# Patient Record
Sex: Female | Born: 1995 | Race: White | Hispanic: No | Marital: Married | Smoking: Never smoker
Health system: Southern US, Community
[De-identification: ages and names within clinical notes are randomized; demographics above are authoritative.]

## PROBLEM LIST (undated history)

## (undated) ENCOUNTER — Inpatient Hospital Stay (HOSPITAL_COMMUNITY): Payer: Self-pay

## (undated) DIAGNOSIS — N200 Calculus of kidney: Secondary | ICD-10-CM

## (undated) DIAGNOSIS — E079 Disorder of thyroid, unspecified: Secondary | ICD-10-CM

## (undated) DIAGNOSIS — E039 Hypothyroidism, unspecified: Secondary | ICD-10-CM

## (undated) DIAGNOSIS — Z87442 Personal history of urinary calculi: Secondary | ICD-10-CM

## (undated) HISTORY — PX: WISDOM TOOTH EXTRACTION: SHX21

## (undated) HISTORY — PX: CYSTOSCOPY W/ URETERAL STENT REMOVAL: SHX1430

---
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
## 2018-02-01 LAB — OB RESULTS CONSOLE GC/CHLAMYDIA
Chlamydia: NEGATIVE
Gonorrhea: NEGATIVE

## 2018-03-13 LAB — OB RESULTS CONSOLE RUBELLA ANTIBODY, IGM: Rubella: IMMUNE

## 2018-03-13 LAB — OB RESULTS CONSOLE ABO/RH: RH Type: POSITIVE

## 2018-03-13 LAB — OB RESULTS CONSOLE HIV ANTIBODY (ROUTINE TESTING): HIV: NONREACTIVE

## 2018-03-13 LAB — OB RESULTS CONSOLE HEPATITIS B SURFACE ANTIGEN: Hepatitis B Surface Ag: NEGATIVE

## 2018-03-13 LAB — OB RESULTS CONSOLE ANTIBODY SCREEN: ANTIBODY SCREEN: NEGATIVE

## 2018-03-13 LAB — OB RESULTS CONSOLE RPR: RPR: NONREACTIVE

## 2018-06-03 ENCOUNTER — Other Ambulatory Visit: Payer: Self-pay | Admitting: Obstetrics and Gynecology

## 2018-06-03 DIAGNOSIS — N200 Calculus of kidney: Secondary | ICD-10-CM

## 2018-06-04 ENCOUNTER — Ambulatory Visit
Admission: RE | Admit: 2018-06-04 | Discharge: 2018-06-04 | Disposition: A | Discharge status: Home / Self Care | Payer: No Typology Code available for payment source | Source: Ambulatory Visit | Attending: Obstetrics and Gynecology | Admitting: Obstetrics and Gynecology

## 2018-06-04 DIAGNOSIS — N200 Calculus of kidney: Secondary | ICD-10-CM

## 2018-06-07 ENCOUNTER — Other Ambulatory Visit: Payer: No Typology Code available for payment source

## 2018-07-16 ENCOUNTER — Encounter (HOSPITAL_COMMUNITY): Payer: Self-pay | Admitting: *Deleted

## 2018-07-16 ENCOUNTER — Inpatient Hospital Stay (HOSPITAL_COMMUNITY)
Admission: AD | Admit: 2018-07-16 | Discharge: 2018-07-16 | Disposition: A | Discharge status: Home / Self Care | Payer: Medicaid Other | Source: Ambulatory Visit | Attending: Obstetrics | Admitting: Obstetrics

## 2018-07-16 DIAGNOSIS — O212 Late vomiting of pregnancy: Secondary | ICD-10-CM | POA: Insufficient documentation

## 2018-07-16 DIAGNOSIS — O219 Vomiting of pregnancy, unspecified: Secondary | ICD-10-CM | POA: Diagnosis not present

## 2018-07-16 DIAGNOSIS — O99613 Diseases of the digestive system complicating pregnancy, third trimester: Secondary | ICD-10-CM | POA: Insufficient documentation

## 2018-07-16 DIAGNOSIS — O368130 Decreased fetal movements, third trimester, not applicable or unspecified: Secondary | ICD-10-CM | POA: Diagnosis present

## 2018-07-16 DIAGNOSIS — Z3A30 30 weeks gestation of pregnancy: Secondary | ICD-10-CM | POA: Diagnosis not present

## 2018-07-16 DIAGNOSIS — R12 Heartburn: Secondary | ICD-10-CM | POA: Diagnosis not present

## 2018-07-16 DIAGNOSIS — O368120 Decreased fetal movements, second trimester, not applicable or unspecified: Secondary | ICD-10-CM

## 2018-07-16 DIAGNOSIS — O26893 Other specified pregnancy related conditions, third trimester: Secondary | ICD-10-CM

## 2018-07-16 LAB — URINALYSIS, ROUTINE W REFLEX MICROSCOPIC
BILIRUBIN URINE: NEGATIVE
GLUCOSE, UA: NEGATIVE mg/dL
Hgb urine dipstick: NEGATIVE
Ketones, ur: NEGATIVE mg/dL
LEUKOCYTES UA: NEGATIVE
NITRITE: NEGATIVE
PH: 6 (ref 5.0–8.0)
PROTEIN: NEGATIVE mg/dL
Specific Gravity, Urine: 1.011 (ref 1.005–1.030)

## 2018-07-16 MED ORDER — ONDANSETRON 8 MG PO TBDP: 8.0000 mg | ORAL_TABLET | Freq: Three times a day (TID) | ORAL | 0 refills | Status: DC | PRN

## 2018-07-16 MED ORDER — ONDANSETRON HCL 4 MG/2ML IJ SOLN
4.0000 mg | Freq: Once | INTRAMUSCULAR | Status: AC
  Administered 2018-07-16: 4 mg via INTRAVENOUS
  Filled 2018-07-16: qty 2

## 2018-07-16 MED ORDER — FAMOTIDINE IN NACL 20-0.9 MG/50ML-% IV SOLN
20.0000 mg | Freq: Once | INTRAVENOUS | Status: AC
  Administered 2018-07-16: 20 mg via INTRAVENOUS
  Filled 2018-07-16: qty 50

## 2018-07-16 MED ORDER — LACTATED RINGERS IV BOLUS
1000.0000 mL | Freq: Once | INTRAVENOUS | Status: AC
  Administered 2018-07-16: 1000 mL via INTRAVENOUS

## 2018-07-16 NOTE — MAU Provider Note (Signed)
History     CSN: [id number]  Arrival date and time: 07/16/18 1429   First Provider Initiated Contact with Patient 07/16/18 1537      Chief Complaint  Patient presents with  . Emesis  . Decreased Fetal Movement   HPI Jenny Barnes is a 22 y.o. G1P0 at 395w3d who presents with vomiting and decreased fetal movement. She states she vomited once last night and felt like it was related to her acid reflux but has continued to vomit throughout the day. She denies diarrhea. 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 She denies any leaking or bleeding. She reports decreased fetal movement this morning but feels normal movement since being in MAU. She denies any abdominal pain or contractions.   OB History    Gravida  1   Para      Term      Preterm      AB      Living        SAB      TAB      Ectopic      Multiple      Live Births              Past Medical History:  Diagnosis Date  . Thyroid disease     Past Surgical History:  Procedure Laterality Date  . WISDOM TOOTH EXTRACTION      Family History  Problem Relation Age of Onset  . Kidney Stones Mother   . Kidney Stones Sister     Social History   Tobacco Use  . Smoking status: Never Smoker  . Smokeless tobacco: Never Used  Substance Use Topics  . Alcohol use: No  . Drug use: Never    Allergies: No Known Allergies  Medications Prior to Admission  Medication Sig Dispense Refill Last Dose  . ferrous sulfate 325 (65 FE) MG tablet Take 325 mg by mouth daily with breakfast.   07/16/2018 at Unknown time  . levothyroxine (SYNTHROID, LEVOTHROID) 150 MCG tablet Take 150 mcg by mouth daily before breakfast.   07/16/2018 at Unknown time  . Prenatal Vit-Fe Fumarate-FA (PRENATAL MULTIVITAMIN) TABS tablet Take 1 tablet by mouth daily at 12 noon.   07/16/2018 at Unknown time    Review of Systems  Constitutional: Negative.  Negative for fatigue and fever.  HENT: Negative.   Respiratory:  Negative.  Negative for shortness of breath.   Cardiovascular: Negative.  Negative for chest pain.  Gastrointestinal: Positive for nausea and vomiting. Negative for abdominal pain, constipation and diarrhea.  Genitourinary: Negative.  Negative for dysuria, vaginal bleeding and vaginal discharge.  Neurological: Negative.  Negative for dizziness and headaches.   Physical Exam   Blood pressure (!) 103/58, pulse 82, temperature 98.2 F (36.8 C), temperature source Oral, resp. rate 18, height 5\' 2"  (1.575 m), weight 60.4 kg.  Physical Exam  Nursing note and vitals reviewed. Constitutional: She is oriented to person, place, and time. She appears well-developed and well-nourished. No distress.  HENT:  Head: Normocephalic.  Eyes: Pupils are equal, round, and reactive to light.  Cardiovascular: Normal rate, regular rhythm and normal heart sounds.  Respiratory: Effort normal and breath sounds normal. No respiratory distress.  GI: Soft. Bowel sounds are normal. She exhibits no distension. There is no tenderness.  Neurological: She is alert and oriented to person, place, and time.  Skin: Skin is warm and dry.  Psychiatric: She has a normal mood and affect. Her behavior is normal. Judgment and  thought content normal.   Fetal Tracing:  Baseline: 135 Variability: moderate Accels: 15x15 Decels: none  Toco: none  MAU Course  Procedures Results for orders placed or performed during the hospital encounter of 07/16/18 (from the past 24 hour(s))  Urinalysis, Routine w reflex microscopic     Status: Abnormal   Collection Time: 07/16/18  2:59 PM  Result Value Ref Range   Color, Urine YELLOW YELLOW   APPearance HAZY (A) CLEAR   Specific Gravity, Urine 1.011 1.005 - 1.030   pH 6.0 5.0 - 8.0   Glucose, UA NEGATIVE NEGATIVE mg/dL   Hgb urine dipstick NEGATIVE NEGATIVE   Bilirubin Urine NEGATIVE NEGATIVE   Ketones, ur NEGATIVE NEGATIVE mg/dL   Protein, ur NEGATIVE NEGATIVE mg/dL   Nitrite  NEGATIVE NEGATIVE   Leukocytes, UA NEGATIVE NEGATIVE   MDM Prenatal records from private office reviewed. Pregnancy uncomplicated. Labs ordered and reviewed.  UA LR bolus Pepcid IV Zofran IV  NST reactive, patient reports feeling normal fetal movement.  Patient able to tolerate graham crackers and sprite  Assessment and Plan   1. Nausea and vomiting in pregnancy   2. [redacted] weeks gestation of pregnancy   3. Heartburn during pregnancy in third trimester    -Discharge home in stable condition -Rx for zofran, constipation precautions reviewed. Encouraged patient to restart pepcid for heartburn -GI bug precautions discussed -Patient advised to follow-up with [facility] as scheduled for prenatal care -Patient may return to MAU as needed or if her condition were to change or worsen   Rolm Bookbinder CNM 07/16/2018, 3:37 PM

## 2018-07-16 NOTE — Discharge Instructions (Signed)
Safe Medications in Pregnancy   Acne: Benzoyl Peroxide Salicylic Acid  Backache/Headache: Tylenol: 2 regular strength every 4 hours OR              2 Extra strength every 6 hours  Colds/Coughs/Allergies: Benadryl (alcohol free) 25 mg every 6 hours as needed Breath right strips Claritin Cepacol throat lozenges Chloraseptic throat spray Cold-Eeze- up to three times per day Cough drops, alcohol free Flonase (by prescription only) Guaifenesin Mucinex Robitussin DM (plain only, alcohol free) Saline nasal spray/drops Sudafed (pseudoephedrine) & Actifed ** use only after [redacted] weeks gestation and if you do not have high blood pressure Tylenol Vicks Vaporub Zinc lozenges Zyrtec   Constipation: Colace Ducolax suppositories Fleet enema Glycerin suppositories Metamucil Milk of magnesia Miralax Senokot Smooth move tea  Diarrhea: Kaopectate Imodium A-D  *NO pepto Bismol  Hemorrhoids: Anusol Anusol HC Preparation H Tucks  Indigestion: Tums Maalox Mylanta Zantac  Pepcid  Insomnia: Benadryl (alcohol free) 25mg  every 6 hours as needed Tylenol PM Unisom, no Gelcaps  Leg Cramps: Tums MagGel  Nausea/Vomiting:  Bonine Dramamine Emetrol Ginger extract Sea bands Meclizine  Nausea medication to take during pregnancy:  Unisom (doxylamine succinate 25 mg tablets) Take one tablet daily at bedtime. If symptoms are not adequately controlled, the dose can be increased to a maximum recommended dose of two tablets daily (1/2 tablet in the morning, 1/2 tablet mid-afternoon and one at bedtime). Vitamin B6 100mg  tablets. Take one tablet twice a day (up to 200 mg per day).  Skin Rashes: Aveeno products Benadryl cream or 25mg  every 6 hours as needed Calamine Lotion 1% cortisone cream  Yeast infection: Gyne-lotrimin 7 Monistat 7   **If taking multiple medications, please check labels to avoid duplicating the same active ingredients **take medication as directed on  the label ** Do not exceed 4000 mg of tylenol in 24 hours **Do not take medications that contain aspirin or ibuprofen     Heartburn During Pregnancy Heartburn is a type of pain or discomfort that can happen in the throat or chest. It is often described as a burning sensation. Heartburn is common during pregnancy because:  A hormone (progesterone) that is released during pregnancy may relax the valve (lower esophageal sphincter, or LES) that separates the esophagus from the stomach. This allows stomach acid to move up into the esophagus, causing heartburn.  The uterus gets larger and pushes up on the stomach, which pushes more acid into the esophagus. This is especially true in the later stages of pregnancy.  Heartburn usually goes away or gets better after giving birth. What are the causes? Heartburn is caused by stomach acid backing up into the esophagus (reflux). Reflux can be triggered by:  Changing hormone levels.  Large meals.  Certain foods and beverages, such as coffee, chocolate, onions, and peppermint.  Exercise.  Increased stomach acid production.  What increases the risk? You are more likely to experience heartburn during pregnancy if you:  Had heartburn prior to becoming pregnant.  Have been pregnant more than once before.  Are overweight or obese.  The likelihood that you will get heartburn also increases as you get farther along in your pregnancy, especially during the last trimester. What are the signs or symptoms? Symptoms of this condition include:  Burning pain in the chest or lower throat.  Bitter taste in the mouth.  Coughing.  Problems swallowing.  Vomiting.  Hoarse voice.  Asthma.  Symptoms may get worse when you lie down or bend over. Symptoms are often  worse at night. How is this diagnosed? This condition is diagnosed based on:  Your medical history.  Your symptoms.  Blood tests to check for a certain type of bacteria associated  with heartburn.  Whether taking heartburn medicine relieves your symptoms.  Examination of the stomach and esophagus using a tube with a light and camera on the end (endoscopy).  How is this treated? Treatment varies depending on how severe your symptoms are. Your health care provider may recommend:  Over-the-counter medicines (antacids or acid reducers) for mild heartburn.  Prescription medicines to decrease stomach acid or to protect your stomach lining.  Certain changes in your diet.  Raising the head of your bed so it is higher than the foot of the bed. This helps prevent stomach acid from backing up into the esophagus when you are lying down.  Follow these instructions at home: Eating and drinking  Do not drink alcohol during your pregnancy.  Identify foods and beverages that make your symptoms worse, and avoid them.  Beverages that you may want to avoid include: ? Coffee and tea (with or without caffeine). ? Energy drinks and sports drinks. ? Carbonated drinks or sodas. ? Citrus fruit juices.  Foods that you may want to avoid include: ? Chocolate and cocoa. ? Peppermint and mint flavorings. ? Garlic, onions, and horseradish. ? Spicy and acidic foods, including peppers, chili powder, curry powder, vinegar, hot sauces, and barbecue sauce. ? Citrus fruits, such as oranges, lemons, and limes. ? Tomato-based foods, such as red sauce, chili, and salsa. ? Fried and fatty foods, such as donuts, french fries, potato chips, and high-fat dressings. ? High-fat meats, such as hot dogs, cold cuts, sausage, ham, and bacon. ? High-fat dairy items, such as whole milk, butter, and cheese.  Eat small, frequent meals instead of large meals.  Avoid drinking large amounts of liquid with your meals.  Avoid eating meals during the 2-3 hours before bedtime.  Avoid lying down right after you eat.  Do not exercise right after you eat. Medicines  Take over-the-counter and prescription  medicines only as told by your health care provider.  Do not take aspirin, ibuprofen, or other NSAIDs unless your health care provider tells you to do that.  You may be instructed to avoid medicines that contain sodium bicarbonate. General instructions  If directed, raise the head of your bed about 6 inches (15 cm) by putting blocks under the legs. Sleeping with more pillows does not effectively relieve heartburn because it only changes the position of your head.  Do not use any products that contain nicotine or tobacco, such as cigarettes and e-cigarettes. If you need help quitting, ask your health care provider.  Wear loose-fitting clothing.  Try to reduce your stress, such as with yoga or meditation. If you need help managing stress, ask your health care provider.  Maintain a healthy weight. If you are overweight, work with your health care provider to safely lose weight.  Keep all follow-up visits as told by your health care provider. This is important. Contact a health care provider if:  You develop new symptoms.  Your symptoms do not improve with treatment.  You have unexplained weight loss.  You have difficulty swallowing.  You make loud sounds when you breathe (wheeze).  You have a cough that does not go away.  You have frequent heartburn for more than 2 weeks.  You have nausea or vomiting that does not get better with treatment.  You have pain in  your abdomen. Get help right away if:  You have severe chest pain that spreads to your arm, neck, or jaw.  You feel sweaty, dizzy, or light-headed.  You have shortness of breath.  You have pain when swallowing.  You vomit, and your vomit looks like blood or coffee grounds.  Your stool is bloody or black. This information is not intended to replace advice given to you by your health care provider. Make sure you discuss any questions you have with your health care provider. Document Released: 08/11/2000 Document  Revised: 05/01/2016 Document Reviewed: 05/01/2016 Elsevier Interactive Patient Education  2018 [employer].

## 2018-07-16 NOTE — MAU Note (Signed)
Pt woke up having mid abdominal cramping, started vomiting after that, hasn't been able to keep anything down.  Denies diarrhea.  Also decreased fetal movement.  Has had uc's for the past week, has only had one today, denies bleeding or LOF.

## 2018-08-05 ENCOUNTER — Encounter (HOSPITAL_COMMUNITY): Payer: Self-pay | Admitting: Emergency Medicine

## 2018-08-05 ENCOUNTER — Inpatient Hospital Stay (HOSPITAL_BASED_OUTPATIENT_CLINIC_OR_DEPARTMENT_OTHER): Payer: Medicaid Other

## 2018-08-05 ENCOUNTER — Inpatient Hospital Stay (HOSPITAL_COMMUNITY)
Admission: AD | Admit: 2018-08-05 | Discharge: 2018-08-05 | Disposition: A | Discharge status: Home / Self Care | Payer: Medicaid Other | Attending: Obstetrics and Gynecology | Admitting: Obstetrics and Gynecology

## 2018-08-05 DIAGNOSIS — Z3A33 33 weeks gestation of pregnancy: Secondary | ICD-10-CM

## 2018-08-05 DIAGNOSIS — O4703 False labor before 37 completed weeks of gestation, third trimester: Secondary | ICD-10-CM

## 2018-08-05 DIAGNOSIS — O368130 Decreased fetal movements, third trimester, not applicable or unspecified: Secondary | ICD-10-CM | POA: Insufficient documentation

## 2018-08-05 DIAGNOSIS — Z3689 Encounter for other specified antenatal screening: Secondary | ICD-10-CM

## 2018-08-05 DIAGNOSIS — O479 False labor, unspecified: Secondary | ICD-10-CM

## 2018-08-05 DIAGNOSIS — O368190 Decreased fetal movements, unspecified trimester, not applicable or unspecified: Secondary | ICD-10-CM

## 2018-08-05 HISTORY — DX: Hypothyroidism, unspecified: E03.9

## 2018-08-05 LAB — URINALYSIS, ROUTINE W REFLEX MICROSCOPIC
BILIRUBIN URINE: NEGATIVE
Glucose, UA: NEGATIVE mg/dL
KETONES UR: NEGATIVE mg/dL
Nitrite: NEGATIVE
PROTEIN: NEGATIVE mg/dL
Specific Gravity, Urine: 1.013 (ref 1.005–1.030)
pH: 7 (ref 5.0–8.0)

## 2018-08-05 NOTE — MAU Note (Addendum)
Pt presents to mau with complaints of DFM. States he is less active then he normally is. Pt c/o lower abd pain on the left side that radiates to her back that started abt 0746. Pt states that the pain comes and goes and rates it a 6 on scale 0-10. Denies LOF. Denies bleeding. Denies burning with urination.   FHR Doppler 144.   1146: Pt states that Saturday night she had some contractions that were 10 mins apart. Pt states that she laid down and they calmed down and went away but then came back Sunday night 3 mins apart for abt 45mins.

## 2018-08-05 NOTE — Discharge Instructions (Signed)

## 2018-08-05 NOTE — MAU Provider Note (Signed)
History     CSN: [id number]  Arrival date and time: 08/05/18 1117   First Provider Initiated Contact with Patient 08/05/18 1211      Chief Complaint  Patient presents with  . Decreased Fetal Movement   HPI   Jenny Barnes is a 22 y.o. female G1P0 @ 2337w2d here with decreased fetal movement. She says that since her arrival she is feeling the baby move, however still not like he normally moves. She also has been feeling contractions which she thinks may be the reason she is not feeling as many movements. She has noticed an increase in discharge, no gushes of water. The contractions are irregular.   OB History    Gravida  1   Para      Term      Preterm      AB      Living        SAB      TAB      Ectopic      Multiple      Live Births              Past Medical History:  Diagnosis Date  . Hypothyroidism   . Thyroid disease     Past Surgical History:  Procedure Laterality Date  . WISDOM TOOTH EXTRACTION      Family History  Problem Relation Age of Onset  . Kidney Stones Mother   . Kidney Stones Sister     Social History   Tobacco Use  . Smoking status: Never Smoker  . Smokeless tobacco: Never Used  Substance Use Topics  . Alcohol use: No  . Drug use: Never    Allergies: No Known Allergies  Medications Prior to Admission  Medication Sig Dispense Refill Last Dose  . ferrous sulfate 325 (65 FE) MG tablet Take 325 mg by mouth daily with breakfast.   08/05/2018 at Unknown time  . levothyroxine (SYNTHROID, LEVOTHROID) 150 MCG tablet Take 150 mcg by mouth daily before breakfast.   08/05/2018 at Unknown time  . ondansetron (ZOFRAN ODT) 8 MG disintegrating tablet Take 1 tablet (8 mg total) by mouth every 8 (eight) hours as needed for nausea or vomiting. 20 tablet 0 08/04/2018 at Unknown time  . Prenatal Vit-Fe Fumarate-FA (PRENATAL MULTIVITAMIN) TABS tablet Take 1 tablet by mouth daily at 12 noon.   08/05/2018 at Unknown time   Results for  orders placed or performed during the hospital encounter of 08/05/18 (from the past 48 hour(s))  Urinalysis, Routine w reflex microscopic     Status: Abnormal   Collection Time: 08/05/18 11:51 AM  Result Value Ref Range   Color, Urine YELLOW YELLOW   APPearance CLOUDY (A) CLEAR   Specific Gravity, Urine 1.013 1.005 - 1.030   pH 7.0 5.0 - 8.0   Glucose, UA NEGATIVE NEGATIVE mg/dL   Hgb urine dipstick SMALL (A) NEGATIVE   Bilirubin Urine NEGATIVE NEGATIVE   Ketones, ur NEGATIVE NEGATIVE mg/dL   Protein, ur NEGATIVE NEGATIVE mg/dL   Nitrite NEGATIVE NEGATIVE   Leukocytes, UA MODERATE (A) NEGATIVE   RBC / HPF 21-50 0 - 5 RBC/hpf   WBC, UA 6-10 0 - 5 WBC/hpf   Bacteria, UA RARE (A) NONE SEEN   Squamous Epithelial / LPF 6-10 0 - 5   Mucus PRESENT     Comment: Performed at [facility], [address] [place], [place] [id number]   Review of Systems  Constitutional: Negative for fever.  Gastrointestinal: Negative for  abdominal pain (Occasional contraction).   Physical Exam   Blood pressure 111/70, pulse 95, temperature 97.9 F (36.6 C), temperature source Oral, resp. rate 18, height 5\' 2"  (1.575 m), weight 62.7 kg, SpO2 100 %.  Physical Exam  Constitutional: She is oriented to person, place, and time. She appears well-developed and well-nourished. No distress.  HENT:  Head: Normocephalic.  Respiratory: Effort normal.  Genitourinary:  Genitourinary Comments: Dilation: Closed Effacement (%): Thick Cervical Position: Posterior Exam by:: Venia Carbon, RN   Musculoskeletal: Normal range of motion.  Neurological: She is alert and oriented to person, place, and time.  Skin: Skin is warm. She is not diaphoretic.  Psychiatric: Her behavior is normal.   Fetal Tracing: Baseline: 140 bpm Variability: Moderate  Accelerations: 15x15 Decelerations: None Toco: UI  MAU Course  Procedures  None  MDM  Reactive NST, BPP 8/8 Urine culture collected   Assessment and Plan    A:  1. NST (non-stress test) reactive   2. Decreased fetal movement   3. Braxton Hick's contraction     P:  Discharge home In stable condition Return to MAU if symptoms worsen Keep f/u appointment as scheduled. Fetal kick counts  Venia Carbon I, NP 08/05/2018 3:43 PM

## 2018-08-06 LAB — CULTURE, OB URINE
Culture: NO GROWTH
SPECIAL REQUESTS: NORMAL

## 2018-08-28 ENCOUNTER — Other Ambulatory Visit: Payer: Self-pay

## 2018-08-28 ENCOUNTER — Inpatient Hospital Stay (HOSPITAL_COMMUNITY)
Admission: AD | Admit: 2018-08-28 | Discharge: 2018-08-28 | Disposition: A | Discharge status: Home / Self Care | Payer: Medicaid Other | Attending: Obstetrics and Gynecology | Admitting: Obstetrics and Gynecology

## 2018-08-28 ENCOUNTER — Encounter (HOSPITAL_COMMUNITY): Payer: Self-pay

## 2018-08-28 ENCOUNTER — Inpatient Hospital Stay (HOSPITAL_BASED_OUTPATIENT_CLINIC_OR_DEPARTMENT_OTHER): Payer: Medicaid Other

## 2018-08-28 DIAGNOSIS — Z3689 Encounter for other specified antenatal screening: Secondary | ICD-10-CM

## 2018-08-28 DIAGNOSIS — O368190 Decreased fetal movements, unspecified trimester, not applicable or unspecified: Secondary | ICD-10-CM

## 2018-08-28 DIAGNOSIS — Z3A36 36 weeks gestation of pregnancy: Secondary | ICD-10-CM

## 2018-08-28 DIAGNOSIS — O368130 Decreased fetal movements, third trimester, not applicable or unspecified: Secondary | ICD-10-CM

## 2018-08-28 LAB — URINALYSIS, ROUTINE W REFLEX MICROSCOPIC
BILIRUBIN URINE: NEGATIVE
Glucose, UA: NEGATIVE mg/dL
Hgb urine dipstick: NEGATIVE
Ketones, ur: NEGATIVE mg/dL
Nitrite: NEGATIVE
Protein, ur: NEGATIVE mg/dL
Specific Gravity, Urine: 1.005 (ref 1.005–1.030)
pH: 7 (ref 5.0–8.0)

## 2018-08-28 NOTE — MAU Note (Signed)
Pt states she has an anterior placenta.   Pt states the last time she felt the baby move was 0700.   Pt tried to lay on her left side, drinking a soda, and eating something sweet.   Denies vaginal bleeding or LOF.

## 2018-08-28 NOTE — Discharge Instructions (Signed)

## 2018-08-28 NOTE — L&D Delivery Note (Signed)
Patient was C/C/+3 and pushed for 20 minutes with epidural.    NSVD  female infant, Apgars 8,9, weight P.   The patient had a first degree midline perineal and R labial lacerations repaired with 2-0 vicrylR. Fundus was firm. EBL was expected amount. Placenta was delivered intact. Vagina was clear.  Delayed cord clamping done for 30-60 seconds while warming baby. Baby was vigorous and doing skin to skin with mother.  Loney Laurence

## 2018-08-28 NOTE — MAU Provider Note (Addendum)
History     CSN: [id number]  Arrival date and time: 08/28/18 1911   First Provider Initiated Contact with Patient 08/28/18 1946      Chief Complaint  Patient presents with  . Decreased Fetal Movement   HPI Jenny Barnes is a 23 y.o. G1P0 at [redacted]w[redacted]d who presents with decreased fetal movement. She states she has only felt one movement all day. She reports feeling one movement since being in MAU. She denies any leaking or bleeding. Reports braxton hicks contractions.   OB History    Gravida  1   Para      Term      Preterm      AB      Living        SAB      TAB      Ectopic      Multiple      Live Births              Past Medical History:  Diagnosis Date  . Hypothyroidism   . Thyroid disease     Past Surgical History:  Procedure Laterality Date  . WISDOM TOOTH EXTRACTION      Family History  Problem Relation Age of Onset  . Kidney Stones Mother   . Kidney Stones Sister     Social History   Tobacco Use  . Smoking status: Never Smoker  . Smokeless tobacco: Never Used  Substance Use Topics  . Alcohol use: No  . Drug use: Never    Allergies: No Known Allergies  Medications Prior to Admission  Medication Sig Dispense Refill Last Dose  . ferrous sulfate 325 (65 FE) MG tablet Take 325 mg by mouth daily with breakfast.   08/28/2018 at Unknown time  . levothyroxine (SYNTHROID, LEVOTHROID) 150 MCG tablet Take 150 mcg by mouth daily before breakfast.   08/28/2018 at Unknown time  . ondansetron (ZOFRAN ODT) 8 MG disintegrating tablet Take 1 tablet (8 mg total) by mouth every 8 (eight) hours as needed for nausea or vomiting. 20 tablet 0 08/27/2018 at Unknown time  . Prenatal Vit-Fe Fumarate-FA (PRENATAL MULTIVITAMIN) TABS tablet Take 1 tablet by mouth daily at 12 noon.   08/28/2018 at Unknown time    Review of Systems  Constitutional: Negative.  Negative for fatigue and fever.  HENT: Negative.   Respiratory: Negative.  Negative for shortness of  breath.   Cardiovascular: Negative.  Negative for chest pain.  Gastrointestinal: Negative.  Negative for abdominal pain, constipation, diarrhea, nausea and vomiting.  Genitourinary: Negative.  Negative for dysuria, vaginal bleeding and vaginal discharge.  Neurological: Negative.  Negative for dizziness and headaches.   Physical Exam   Blood pressure 109/62, pulse 85, temperature 97.9 F (36.6 C), temperature source Oral, resp. rate 20, SpO2 100 %.  Physical Exam  Nursing note and vitals reviewed. Constitutional: She is oriented to person, place, and time. She appears well-developed and well-nourished. No distress.  HENT:  Head: Normocephalic.  Eyes: Pupils are equal, round, and reactive to light.  Cardiovascular: Normal rate, regular rhythm and normal heart sounds.  Respiratory: Effort normal and breath sounds normal. No respiratory distress.  GI: Soft. Bowel sounds are normal. She exhibits no distension. There is no abdominal tenderness.  Neurological: She is alert and oriented to person, place, and time.  Skin: Skin is warm and dry.  Psychiatric: She has a normal mood and affect. Her behavior is normal. Judgment and thought content normal.   Fetal Tracing:  Baseline:  135 Variability: moderate Accels: 15x15 Decels: none  Toco: occasional uc's  Results for orders placed or performed during the hospital encounter of 08/28/18 (from the past 24 hour(s))  Urinalysis, Routine w reflex microscopic     Status: Abnormal   Collection Time: 08/28/18  7:22 PM  Result Value Ref Range   Color, Urine STRAW (A) YELLOW   APPearance HAZY (A) CLEAR   Specific Gravity, Urine 1.005 1.005 - 1.030   pH 7.0 5.0 - 8.0   Glucose, UA NEGATIVE NEGATIVE mg/dL   Hgb urine dipstick NEGATIVE NEGATIVE   Bilirubin Urine NEGATIVE NEGATIVE   Ketones, ur NEGATIVE NEGATIVE mg/dL   Protein, ur NEGATIVE NEGATIVE mg/dL   Nitrite NEGATIVE NEGATIVE   Leukocytes, UA SMALL (A) NEGATIVE   RBC / HPF 0-5 0 - 5  RBC/hpf   WBC, UA 6-10 0 - 5 WBC/hpf   Bacteria, UA RARE (A) NONE SEEN   Squamous Epithelial / LPF 6-10 0 - 5   Mucus PRESENT    MAU Course  Procedures  MDM Reactive NST MFM BPP  Care turned over to M. Laurabelle Gorczyca CNM at [address] [place], [place] 08/28/18 7:59 PM  BPP ordered. NST reactive. BPP 8/8 >10/10. AFI 15 cm. Now feeling FM since return from [place]. Discussed FMCs. Stable for discharge home.   Assessment and Plan   1. [redacted] weeks gestation of pregnancy   2. Decreased fetal movement   3. NST (non-stress test) reactive    Discharge home Follow [facility] at [facility] in [facility]  Allergies as of 08/28/2018   No Known Allergies     Medication List    TAKE these medications   ferrous sulfate 325 (65 FE) MG tablet Take 325 mg by mouth daily with breakfast.   levothyroxine 150 MCG tablet Commonly known as:  SYNTHROID, LEVOTHROID Take 150 mcg by mouth daily before breakfast.   ondansetron 8 MG disintegrating tablet Commonly known as:  ZOFRAN ODT Take 1 tablet (8 mg total) by mouth every 8 (eight) hours as needed for nausea or vomiting.   prenatal multivitamin Tabs tablet Take 1 tablet by mouth daily at 12 noon.      Donette Larry, CNM  08/28/2018 8:57 PM

## 2018-08-30 LAB — CULTURE, OB URINE: Culture: 80000 — AB

## 2018-08-30 LAB — OB RESULTS CONSOLE GBS: GBS: NEGATIVE

## 2018-09-04 ENCOUNTER — Inpatient Hospital Stay (HOSPITAL_COMMUNITY)
Admission: AD | Admit: 2018-09-04 | Discharge: 2018-09-04 | Disposition: A | Discharge status: Home / Self Care | Payer: Medicaid Other | Source: Ambulatory Visit | Attending: Obstetrics and Gynecology | Admitting: Obstetrics and Gynecology

## 2018-09-04 ENCOUNTER — Other Ambulatory Visit: Payer: Self-pay

## 2018-09-04 ENCOUNTER — Encounter (HOSPITAL_COMMUNITY): Payer: Self-pay

## 2018-09-04 DIAGNOSIS — R109 Unspecified abdominal pain: Secondary | ICD-10-CM | POA: Insufficient documentation

## 2018-09-04 DIAGNOSIS — Z3A37 37 weeks gestation of pregnancy: Secondary | ICD-10-CM | POA: Insufficient documentation

## 2018-09-04 DIAGNOSIS — O26893 Other specified pregnancy related conditions, third trimester: Secondary | ICD-10-CM | POA: Diagnosis not present

## 2018-09-04 DIAGNOSIS — M7918 Myalgia, other site: Secondary | ICD-10-CM | POA: Insufficient documentation

## 2018-09-04 DIAGNOSIS — K219 Gastro-esophageal reflux disease without esophagitis: Secondary | ICD-10-CM | POA: Diagnosis not present

## 2018-09-04 DIAGNOSIS — O99613 Diseases of the digestive system complicating pregnancy, third trimester: Secondary | ICD-10-CM | POA: Diagnosis not present

## 2018-09-04 DIAGNOSIS — R1011 Right upper quadrant pain: Secondary | ICD-10-CM | POA: Diagnosis present

## 2018-09-04 DIAGNOSIS — O26899 Other specified pregnancy related conditions, unspecified trimester: Secondary | ICD-10-CM

## 2018-09-04 LAB — COMPREHENSIVE METABOLIC PANEL
ALBUMIN: 3.3 g/dL — AB (ref 3.5–5.0)
ALK PHOS: 71 U/L (ref 38–126)
ALT: 11 U/L (ref 0–44)
ANION GAP: 8 (ref 5–15)
AST: 14 U/L — AB (ref 15–41)
BILIRUBIN TOTAL: 0.3 mg/dL (ref 0.3–1.2)
BUN: 9 mg/dL (ref 6–20)
CO2: 23 mmol/L (ref 22–32)
Calcium: 9.1 mg/dL (ref 8.9–10.3)
Chloride: 104 mmol/L (ref 98–111)
Creatinine, Ser: 0.62 mg/dL (ref 0.44–1.00)
GFR calc Af Amer: >60 mL/min (ref >60)
GLUCOSE: 95 mg/dL (ref 70–99)
POTASSIUM: 3.7 mmol/L (ref 3.5–5.1)
Sodium: 135 mmol/L (ref 135–145)
TOTAL PROTEIN: 6.5 g/dL (ref 6.5–8.1)

## 2018-09-04 LAB — URINALYSIS, ROUTINE W REFLEX MICROSCOPIC
Bilirubin Urine: NEGATIVE
GLUCOSE, UA: NEGATIVE mg/dL
Hgb urine dipstick: NEGATIVE
Ketones, ur: NEGATIVE mg/dL
Nitrite: NEGATIVE
PH: 7 (ref 5.0–8.0)
Protein, ur: NEGATIVE mg/dL
Specific Gravity, Urine: 1.004 — ABNORMAL LOW (ref 1.005–1.030)

## 2018-09-04 LAB — CBC
HEMATOCRIT: 31.2 % — AB (ref 36.0–46.0)
HEMOGLOBIN: 10.4 g/dL — AB (ref 12.0–15.0)
MCH: 31.2 pg (ref 26.0–34.0)
MCHC: 33.3 g/dL (ref 30.0–36.0)
MCV: 93.7 fL (ref 80.0–100.0)
Platelets: 176 10*3/uL (ref 150–400)
RBC: 3.33 MIL/uL — ABNORMAL LOW (ref 3.87–5.11)
RDW: 12.9 % (ref 11.5–15.5)
WBC: 9 10*3/uL (ref 4.0–10.5)
nRBC: 0 % (ref 0.0–0.2)

## 2018-09-04 LAB — LIPASE, BLOOD: LIPASE: 27 U/L (ref 11–51)

## 2018-09-04 MED ORDER — ONDANSETRON 8 MG PO TBDP
8.0000 mg | ORAL_TABLET | Freq: Once | ORAL | Status: AC
  Administered 2018-09-04: 8 mg via ORAL
  Filled 2018-09-04: qty 1

## 2018-09-04 MED ORDER — LIDOCAINE VISCOUS HCL 2 % MT SOLN
15.0000 mL | Freq: Once | OROMUCOSAL | Status: AC
  Administered 2018-09-04: 15 mL via ORAL
  Filled 2018-09-04: qty 15

## 2018-09-04 MED ORDER — ALUM & MAG HYDROXIDE-SIMETH 200-200-20 MG/5ML PO SUSP
30.0000 mL | Freq: Once | ORAL | Status: AC
  Administered 2018-09-04: 30 mL via ORAL
  Filled 2018-09-04: qty 30

## 2018-09-04 MED ORDER — CYCLOBENZAPRINE HCL 10 MG PO TABS: 5.0000 mg | ORAL_TABLET | Freq: Three times a day (TID) | ORAL | 0 refills | Status: DC | PRN

## 2018-09-04 NOTE — MAU Note (Signed)
Pt reports ctx's starting at 1000 with accompanying back pain.    Pt reports stabbing pain across upper abdominal area.   Pt reports every 30 minutes having a dizzy spell and if she closes her eyes she feels dizzy consistently.  Denies vaginal bleeding, denies LOF.   Reports +FM

## 2018-09-04 NOTE — MAU Note (Signed)
Urine sent to lab 

## 2018-09-04 NOTE — MAU Provider Note (Addendum)
Chief Complaint:  Contractions   First Provider Initiated Contact with Patient 09/04/18 1607      HPI: Jenny Barnes is a 23 y.o. G1P0 at [redacted]w[redacted]d by early ultrasound who presents to maternity admissions reporting pain in right upper abdomen, nausea, contractions, and back pain. She reports waking up with more nausea than usual. Then, at 10 am she had a sudden onset of back pain that stayed for 1-2 hours.  At 11 am she reports onset of sharp stabbing pain in her RUQ that wraps over her entire upper abdomen.  This is also associated with dizziness, worse when she closes her eyes. She reports eating a banana and and apple before 11 and a lunch with chicken and rice. She denies any vomiting.  There are no other symptoms. She has not tried any treatments. She reports good fetal movement.  HPI  Past Medical History: Past Medical History:  Diagnosis Date  . Hypothyroidism   . Thyroid disease     Past obstetric history: OB History  Gravida Para Term Preterm AB Living  1            SAB TAB Ectopic Multiple Live Births               # Outcome Date GA Lbr Len/2nd Weight Sex Delivery Anes PTL Lv  1 Current             Past Surgical History: Past Surgical History:  Procedure Laterality Date  . WISDOM TOOTH EXTRACTION      Family History: Family History  Problem Relation Age of Onset  . Kidney Stones Mother   . Kidney Stones Sister     Social History: Social History   Tobacco Use  . Smoking status: Never Smoker  . Smokeless tobacco: Never Used  Substance Use Topics  . Alcohol use: No  . Drug use: Never    Allergies: No Known Allergies  Meds:  Medications Prior to Admission  Medication Sig Dispense Refill Last Dose  . famotidine (PEPCID) 20 MG tablet Take 40 mg by mouth at bedtime.   09/03/2018 at Unknown time  . ferrous sulfate 325 (65 FE) MG tablet Take 325 mg by mouth every other day.    09/04/2018 at Unknown time  . levothyroxine (SYNTHROID, LEVOTHROID) 150 MCG  tablet Take 150 mcg by mouth daily before breakfast. Combines with 25 mcg tablet for a total dose of 175 mcg.   09/04/2018 at Unknown time  . levothyroxine (SYNTHROID, LEVOTHROID) 25 MCG tablet Take 25 mcg by mouth daily before breakfast. Combines with 150 mcg tablet for a total dose of 175 mcg.   09/04/2018 at Unknown time  . ondansetron (ZOFRAN ODT) 8 MG disintegrating tablet Take 1 tablet (8 mg total) by mouth every 8 (eight) hours as needed for nausea or vomiting. 20 tablet 0 09/03/2018 at Unknown time  . Prenatal Vit-Fe Fumarate-FA (PRENATAL MULTIVITAMIN) TABS tablet Take 1 tablet by mouth daily at 12 noon.   09/04/2018 at Unknown time    ROS:  Review of Systems  Constitutional: Negative for chills, fatigue and fever.  Eyes: Negative for visual disturbance.  Respiratory: Negative for shortness of breath.   Cardiovascular: Negative for chest pain.  Gastrointestinal: Negative for abdominal pain, nausea and vomiting.  Genitourinary: Negative for difficulty urinating, dysuria, flank pain, pelvic pain, vaginal bleeding, vaginal discharge and vaginal pain.  Neurological: Negative for dizziness and headaches.  Psychiatric/Behavioral: Negative.      I have reviewed patient's Past Medical Hx, Surgical  Hx, Family Hx, Social Hx, medications and allergies.   Physical Exam   Patient Vitals for the past 24 hrs:  BP Temp Pulse Resp SpO2 Weight  09/04/18 1953 108/61 - 81 - - -  09/04/18 1512 104/60 97.7 F (36.5 C) 94 16 94 % -  09/04/18 1501 - - - - - 64.4 kg   Constitutional: Well-developed, well-nourished female in no acute distress.  Cardiovascular: normal rate Respiratory: normal effort GI: Abd soft, non-tender, no rebound tenderness or guarding, gravid appropriate for gestational age.  MS: Extremities nontender, no edema, normal ROM Neurologic: Alert and oriented x 4.  GU: Neg CVAT.   Dilation: 1 Effacement (%): 70 Station: -2 Presentation: Vertex Exam by:: Zenia ResidesNikki Risheq, RN   FHT:   Baseline 140, moderate variability, accelerations present, no decelerations Contractions: rare, mild to palpation   Labs: Results for orders placed or performed during the hospital encounter of 09/04/18 (from the past 24 hour(s))  Urinalysis, Routine w reflex microscopic     Status: Abnormal   Collection Time: 09/04/18  3:15 PM  Result Value Ref Range   Color, Urine STRAW (A) YELLOW   APPearance HAZY (A) CLEAR   Specific Gravity, Urine 1.004 (L) 1.005 - 1.030   pH 7.0 5.0 - 8.0   Glucose, UA NEGATIVE NEGATIVE mg/dL   Hgb urine dipstick NEGATIVE NEGATIVE   Bilirubin Urine NEGATIVE NEGATIVE   Ketones, ur NEGATIVE NEGATIVE mg/dL   Protein, ur NEGATIVE NEGATIVE mg/dL   Nitrite NEGATIVE NEGATIVE   Leukocytes, UA MODERATE (A) NEGATIVE   RBC / HPF 0-5 0 - 5 RBC/hpf   WBC, UA 11-20 0 - 5 WBC/hpf   Bacteria, UA RARE (A) NONE SEEN   Squamous Epithelial / LPF 11-20 0 - 5  CBC     Status: Abnormal   Collection Time: 09/04/18  4:13 PM  Result Value Ref Range   WBC 9.0 4.0 - 10.5 K/uL   RBC 3.33 (L) 3.87 - 5.11 MIL/uL   Hemoglobin 10.4 (L) 12.0 - 15.0 g/dL   HCT 62.931.2 (L) 52.836.0 - 41.346.0 %   MCV 93.7 80.0 - 100.0 fL   MCH 31.2 26.0 - 34.0 pg   MCHC 33.3 30.0 - 36.0 g/dL   RDW 24.412.9 01.011.5 - 27.215.5 %   Platelets 176 150 - 400 K/uL   nRBC 0.0 0.0 - 0.2 %  Comprehensive metabolic panel     Status: Abnormal   Collection Time: 09/04/18  4:13 PM  Result Value Ref Range   Sodium 135 135 - 145 mmol/L   Potassium 3.7 3.5 - 5.1 mmol/L   Chloride 104 98 - 111 mmol/L   CO2 23 22 - 32 mmol/L   Glucose, Bld 95 70 - 99 mg/dL   BUN 9 6 - 20 mg/dL   Creatinine, Ser 5.360.62 0.44 - 1.00 mg/dL   Calcium 9.1 8.9 - 64.410.3 mg/dL   Total Protein 6.5 6.5 - 8.1 g/dL   Albumin 3.3 (L) 3.5 - 5.0 g/dL   AST 14 (L) 15 - 41 U/L   ALT 11 0 - 44 U/L   Alkaline Phosphatase 71 38 - 126 U/L   Total Bilirubin 0.3 0.3 - 1.2 mg/dL   GFR calc non Af Amer >60 >60 mL/min   GFR calc Af Amer >60 >60 mL/min   Anion gap 8 5 - 15   Lipase, blood     Status: None   Collection Time: 09/04/18  4:13 PM  Result Value Ref Range   Lipase  27 11 - 51 U/L      Imaging:    MAU Course/MDM: Orders Placed This Encounter  Procedures  . OB Urine Culture  . CBC  . Comprehensive metabolic panel  . Lipase, blood  . Urinalysis, Routine w reflex microscopic  . Discharge patient    Meds ordered this encounter  Medications  . AND Linked Order Group   . alum & mag hydroxide-simeth (MAALOX/MYLANTA) 200-200-20 MG/5ML suspension 30 mL   . lidocaine (XYLOCAINE) 2 % viscous mouth solution 15 mL  . ondansetron (ZOFRAN-ODT) disintegrating tablet 8 mg  . cyclobenzaprine (FLEXERIL) 10 MG tablet    Sig: Take 0.5-1 tablets (5-10 mg total) by mouth 3 (three) times daily as needed for muscle spasms.    Dispense:  20 tablet    Refill:  0    Order Specific Question:   Supervising Provider    Answer:   Reva BoresPRATT, TANYA S [[id number]]     NST reviewed and reactive No evidence of labor with mild intermittent contractions and cervix 1 cm dilated. No acute abdomen on exam CBC, CMP, lipase wnl GI Cocktail given with improvement in pain but not resolution. Pt reports pain improved with some position changes.   UA results with leukocytes, no hemoglobin but not clean catch. Send for culture. Rest/ice/heat/warm bath/Tylenol for pain. Rx for Flexeril to use sparingly as pain may have musculoskeletal component.   F/U in office as scheduled, return to MAU for signs of labor or emergencies.   Pt discharge with strict labor/return precautions.    Assessment: 1. Abdominal pain affecting pregnancy   2. Mild acid reflux   3. Musculoskeletal pain     Plan: Discharge home Labor precautions and fetal kick counts Follow-up Information    Philip AspenCallahan, Sidney, DO Follow up.   Specialty:  Obstetrics and Gynecology Why:  As scheduled, return to MAU as needed for signs of labor or emergencies. Contact information: [address] [place] [id number] [phone]          Allergies as of 09/04/2018   No Known Allergies     Medication List    TAKE these medications   cyclobenzaprine 10 MG tablet Commonly known as:  FLEXERIL Take 0.5-1 tablets (5-10 mg total) by mouth 3 (three) times daily as needed for muscle spasms.   famotidine 20 MG tablet Commonly known as:  PEPCID Take 40 mg by mouth at bedtime.   ferrous sulfate 325 (65 FE) MG tablet Take 325 mg by mouth every other day.   levothyroxine 150 MCG tablet Commonly known as:  SYNTHROID, LEVOTHROID Take 150 mcg by mouth daily before breakfast. Combines with 25 mcg tablet for a total dose of 175 mcg.   levothyroxine 25 MCG tablet Commonly known as:  SYNTHROID, LEVOTHROID Take 25 mcg by mouth daily before breakfast. Combines with 150 mcg tablet for a total dose of 175 mcg.   ondansetron 8 MG disintegrating tablet Commonly known as:  ZOFRAN ODT Take 1 tablet (8 mg total) by mouth every 8 (eight) hours as needed for nausea or vomiting.   prenatal multivitamin Tabs tablet Take 1 tablet by mouth daily at 12 noon.       Sharen CounterLisa Leftwich-Kirby Certified Nurse-Midwife 09/04/2018 8:25 PM

## 2018-09-06 LAB — CULTURE, OB URINE: Culture: >=100000 — AB

## 2018-09-09 ENCOUNTER — Encounter (HOSPITAL_COMMUNITY): Payer: Self-pay | Admitting: Emergency Medicine

## 2018-09-09 ENCOUNTER — Inpatient Hospital Stay (HOSPITAL_COMMUNITY)
Admission: AD | Admit: 2018-09-09 | Discharge: 2018-09-09 | Disposition: A | Discharge status: Home / Self Care | Payer: Medicaid Other | Attending: Obstetrics | Admitting: Obstetrics

## 2018-09-09 DIAGNOSIS — O479 False labor, unspecified: Secondary | ICD-10-CM

## 2018-09-09 DIAGNOSIS — O471 False labor at or after 37 completed weeks of gestation: Secondary | ICD-10-CM | POA: Insufficient documentation

## 2018-09-09 DIAGNOSIS — Z3A38 38 weeks gestation of pregnancy: Secondary | ICD-10-CM

## 2018-09-09 NOTE — MAU Provider Note (Signed)
S: [name] is a 23 y.o. G1P0 at 3732w2d  who presents to MAU today for labor evaluation.     Cervical exam by RN: no cervical change after reassessment  Dilation: 2 Effacement (%): 70 Cervical Position: Posterior Station: -2 Presentation: Vertex Exam by:: Everlene FarrierHilary Hardy, RN   Fetal Monitoring: Baseline: 125 Variability: moderate  Accelerations: present  Decelerations: none  Contractions: irregular UC/ mild by palpation   MDM Discussed patient with RN. NST reviewed.   A: SIUP at 3632w2d  False labor  P: Discharge home Labor precautions and kick counts included in AVS [facility]  Patient may return to MAU as needed or when in labor   Sharyon CableRogers, Geral Tuch C, [place] 09/09/2018 11:22 PM

## 2018-09-09 NOTE — Discharge Instructions (Signed)
Reasons to return to MAU: ° °1.  Contractions are  3-4 minutes apart or less, each last 1 minute, these have been going on for 1-2 hours, and you cannot walk or talk during them °2.  You have a large gush of fluid, or a trickle of fluid that will not stop and you have to wear a pad °3.  You have bleeding that is bright red, heavier than spotting--like menstrual bleeding (spotting can be normal in early labor or after a check of your cervix) °4.  You do not feel the baby moving like he/she normally does ° °

## 2018-09-09 NOTE — MAU Note (Signed)
CTX every 4 minutes for the past 3 hours.  No LOF.  Some spotting since her cervical exam this AM.  Was 2 cm in the office.  + FM.  No complications with her pregnancy.

## 2018-09-19 ENCOUNTER — Other Ambulatory Visit: Payer: Self-pay

## 2018-09-19 ENCOUNTER — Inpatient Hospital Stay (HOSPITAL_COMMUNITY): Payer: Medicaid Other | Admitting: Anesthesiology

## 2018-09-19 ENCOUNTER — Inpatient Hospital Stay (HOSPITAL_COMMUNITY)
Admission: AD | Admit: 2018-09-19 | Discharge: 2018-09-21 | DRG: 807 | Disposition: A | Discharge status: Home / Self Care | Payer: Medicaid Other | Attending: Obstetrics and Gynecology | Admitting: Obstetrics and Gynecology

## 2018-09-19 ENCOUNTER — Encounter (HOSPITAL_COMMUNITY): Payer: Self-pay | Admitting: *Deleted

## 2018-09-19 DIAGNOSIS — Z3A39 39 weeks gestation of pregnancy: Secondary | ICD-10-CM

## 2018-09-19 DIAGNOSIS — Z3483 Encounter for supervision of other normal pregnancy, third trimester: Secondary | ICD-10-CM | POA: Diagnosis not present

## 2018-09-19 LAB — CBC
HEMATOCRIT: 35.1 % — AB (ref 36.0–46.0)
Hemoglobin: 12 g/dL (ref 12.0–15.0)
MCH: 31.7 pg (ref 26.0–34.0)
MCHC: 34.2 g/dL (ref 30.0–36.0)
MCV: 92.9 fL (ref 80.0–100.0)
Platelets: 202 10*3/uL (ref 150–400)
RBC: 3.78 MIL/uL — ABNORMAL LOW (ref 3.87–5.11)
RDW: 13 % (ref 11.5–15.5)
WBC: 11 10*3/uL — ABNORMAL HIGH (ref 4.0–10.5)
nRBC: 0 % (ref 0.0–0.2)

## 2018-09-19 LAB — TYPE AND SCREEN
ABO/RH(D): B POS
Antibody Screen: NEGATIVE

## 2018-09-19 LAB — ABO/RH: ABO/RH(D): B POS

## 2018-09-19 LAB — RPR: RPR Ser Ql: NONREACTIVE

## 2018-09-19 MED ORDER — OXYTOCIN BOLUS FROM INFUSION
500.0000 mL | Freq: Once | INTRAVENOUS | Status: AC
  Administered 2018-09-19: 500 mL via INTRAVENOUS

## 2018-09-19 MED ORDER — LACTATED RINGERS IV SOLN
INTRAVENOUS | Status: DC
  Administered 2018-09-19 (×2): via INTRAVENOUS

## 2018-09-19 MED ORDER — SOD CITRATE-CITRIC ACID 500-334 MG/5ML PO SOLN: 30.0000 mL | ORAL | Status: DC | PRN

## 2018-09-19 MED ORDER — LEVOTHYROXINE SODIUM 25 MCG PO TABS
25.0000 ug | ORAL_TABLET | Freq: Every day | ORAL | Status: DC
  Administered 2018-09-19 – 2018-09-21 (×3): 25 ug via ORAL
  Filled 2018-09-19 (×3): qty 1

## 2018-09-19 MED ORDER — DIPHENHYDRAMINE HCL 25 MG PO CAPS: 25.0000 mg | ORAL_CAPSULE | Freq: Four times a day (QID) | ORAL | Status: DC | PRN

## 2018-09-19 MED ORDER — OXYTOCIN 40 UNITS IN NORMAL SALINE INFUSION - SIMPLE MED
2.5000 [IU]/h | INTRAVENOUS | Status: DC
  Filled 2018-09-19: qty 1000

## 2018-09-19 MED ORDER — MEASLES, MUMPS & RUBELLA VAC IJ SOLR
0.5000 mL | Freq: Once | INTRAMUSCULAR | Status: DC
  Filled 2018-09-19: qty 0.5

## 2018-09-19 MED ORDER — LEVOTHYROXINE SODIUM 150 MCG PO TABS
150.0000 ug | ORAL_TABLET | Freq: Every day | ORAL | Status: DC
  Administered 2018-09-19 – 2018-09-21 (×3): 150 ug via ORAL
  Filled 2018-09-19 (×3): qty 1

## 2018-09-19 MED ORDER — METHYLERGONOVINE MALEATE 0.2 MG PO TABS: 0.2000 mg | ORAL_TABLET | ORAL | Status: DC | PRN

## 2018-09-19 MED ORDER — ZOLPIDEM TARTRATE 5 MG PO TABS: 5.0000 mg | ORAL_TABLET | Freq: Every evening | ORAL | Status: DC | PRN

## 2018-09-19 MED ORDER — FERROUS SULFATE 325 (65 FE) MG PO TABS
325.0000 mg | ORAL_TABLET | Freq: Two times a day (BID) | ORAL | Status: DC
  Administered 2018-09-19 – 2018-09-21 (×3): 325 mg via ORAL
  Filled 2018-09-19 (×3): qty 1

## 2018-09-19 MED ORDER — IBUPROFEN 800 MG PO TABS
800.0000 mg | ORAL_TABLET | Freq: Three times a day (TID) | ORAL | Status: DC
  Administered 2018-09-19 – 2018-09-21 (×6): 800 mg via ORAL
  Filled 2018-09-19 (×6): qty 1

## 2018-09-19 MED ORDER — FENTANYL 2.5 MCG/ML BUPIVACAINE 1/10 % EPIDURAL INFUSION (WH - ANES)
14.0000 mL/h | INTRAMUSCULAR | Status: DC | PRN
  Administered 2018-09-19 (×2): 14 mL/h via EPIDURAL
  Filled 2018-09-19 (×2): qty 100

## 2018-09-19 MED ORDER — EPHEDRINE 5 MG/ML INJ
10.0000 mg | INTRAVENOUS | Status: DC | PRN
  Filled 2018-09-19: qty 2

## 2018-09-19 MED ORDER — OXYCODONE-ACETAMINOPHEN 5-325 MG PO TABS
2.0000 | ORAL_TABLET | ORAL | Status: DC | PRN
  Filled 2018-09-19: qty 2

## 2018-09-19 MED ORDER — MAGNESIUM HYDROXIDE 400 MG/5ML PO SUSP: 30.0000 mL | ORAL | Status: DC | PRN

## 2018-09-19 MED ORDER — TETANUS-DIPHTH-ACELL PERTUSSIS 5-2.5-18.5 LF-MCG/0.5 IM SUSP: 0.5000 mL | Freq: Once | INTRAMUSCULAR | Status: DC

## 2018-09-19 MED ORDER — FAMOTIDINE 20 MG PO TABS
40.0000 mg | ORAL_TABLET | Freq: Every day | ORAL | Status: DC
  Administered 2018-09-19 – 2018-09-20 (×2): 40 mg via ORAL
  Filled 2018-09-19 (×2): qty 2

## 2018-09-19 MED ORDER — ONDANSETRON HCL 4 MG/2ML IJ SOLN: 4.0000 mg | Freq: Four times a day (QID) | INTRAMUSCULAR | Status: DC | PRN

## 2018-09-19 MED ORDER — LIDOCAINE HCL (PF) 1 % IJ SOLN
30.0000 mL | INTRAMUSCULAR | Status: DC | PRN
  Filled 2018-09-19: qty 30

## 2018-09-19 MED ORDER — BENZOCAINE-MENTHOL 20-0.5 % EX AERO
1.0000 "application " | INHALATION_SPRAY | CUTANEOUS | Status: DC | PRN
  Administered 2018-09-21: 1 via TOPICAL
  Filled 2018-09-19 (×3): qty 56

## 2018-09-19 MED ORDER — SENNOSIDES-DOCUSATE SODIUM 8.6-50 MG PO TABS
2.0000 | ORAL_TABLET | ORAL | Status: DC
  Administered 2018-09-20 (×2): 2 via ORAL
  Filled 2018-09-19 (×2): qty 2

## 2018-09-19 MED ORDER — FLEET ENEMA 7-19 GM/118ML RE ENEM: 1.0000 | ENEMA | RECTAL | Status: DC | PRN

## 2018-09-19 MED ORDER — LACTATED RINGERS IV SOLN
500.0000 mL | INTRAVENOUS | Status: DC | PRN
  Administered 2018-09-19: 500 mL via INTRAVENOUS

## 2018-09-19 MED ORDER — METHYLERGONOVINE MALEATE 0.2 MG/ML IJ SOLN: 0.2000 mg | INTRAMUSCULAR | Status: DC | PRN

## 2018-09-19 MED ORDER — ACETAMINOPHEN 325 MG PO TABS: 650.0000 mg | ORAL_TABLET | ORAL | Status: DC | PRN

## 2018-09-19 MED ORDER — OXYCODONE-ACETAMINOPHEN 5-325 MG PO TABS: 1.0000 | ORAL_TABLET | ORAL | Status: DC | PRN

## 2018-09-19 MED ORDER — COCONUT OIL OIL
1.0000 "application " | TOPICAL_OIL | Status: DC | PRN
  Filled 2018-09-19: qty 120

## 2018-09-19 MED ORDER — DIPHENHYDRAMINE HCL 50 MG/ML IJ SOLN: 12.5000 mg | INTRAMUSCULAR | Status: DC | PRN

## 2018-09-19 MED ORDER — WITCH HAZEL-GLYCERIN EX PADS
1.0000 "application " | MEDICATED_PAD | CUTANEOUS | Status: DC | PRN
  Administered 2018-09-21: 1 via TOPICAL

## 2018-09-19 MED ORDER — SIMETHICONE 80 MG PO CHEW
80.0000 mg | CHEWABLE_TABLET | ORAL | Status: DC | PRN
  Administered 2018-09-19: 80 mg via ORAL
  Filled 2018-09-19: qty 1

## 2018-09-19 MED ORDER — SODIUM CHLORIDE 0.9 % IV SOLN: 250.0000 mL | INTRAVENOUS | Status: DC | PRN

## 2018-09-19 MED ORDER — PHENYLEPHRINE 40 MCG/ML (10ML) SYRINGE FOR IV PUSH (FOR BLOOD PRESSURE SUPPORT)
80.0000 ug | PREFILLED_SYRINGE | INTRAVENOUS | Status: DC | PRN
  Filled 2018-09-19 (×2): qty 10

## 2018-09-19 MED ORDER — PRENATAL MULTIVITAMIN CH
1.0000 | ORAL_TABLET | Freq: Every day | ORAL | Status: DC
  Administered 2018-09-20: 1 via ORAL
  Filled 2018-09-19: qty 1

## 2018-09-19 MED ORDER — ONDANSETRON HCL 4 MG/2ML IJ SOLN: 4.0000 mg | INTRAMUSCULAR | Status: DC | PRN

## 2018-09-19 MED ORDER — OXYCODONE-ACETAMINOPHEN 5-325 MG PO TABS: 2.0000 | ORAL_TABLET | ORAL | Status: DC | PRN

## 2018-09-19 MED ORDER — LIDOCAINE HCL (PF) 1 % IJ SOLN
INTRAMUSCULAR | Status: DC | PRN
  Administered 2018-09-19 (×2): 4 mL via EPIDURAL

## 2018-09-19 MED ORDER — CYCLOBENZAPRINE HCL 5 MG PO TABS
5.0000 mg | ORAL_TABLET | Freq: Three times a day (TID) | ORAL | Status: DC | PRN
  Filled 2018-09-19: qty 2

## 2018-09-19 MED ORDER — ONDANSETRON HCL 4 MG PO TABS: 4.0000 mg | ORAL_TABLET | ORAL | Status: DC | PRN

## 2018-09-19 MED ORDER — DIBUCAINE 1 % RE OINT: 1.0000 "application " | TOPICAL_OINTMENT | RECTAL | Status: DC | PRN

## 2018-09-19 MED ORDER — SODIUM CHLORIDE 0.9% FLUSH: 3.0000 mL | INTRAVENOUS | Status: DC | PRN

## 2018-09-19 MED ORDER — PHENYLEPHRINE 40 MCG/ML (10ML) SYRINGE FOR IV PUSH (FOR BLOOD PRESSURE SUPPORT)
80.0000 ug | PREFILLED_SYRINGE | INTRAVENOUS | Status: DC | PRN
  Filled 2018-09-19: qty 10

## 2018-09-19 MED ORDER — ACETAMINOPHEN 325 MG PO TABS
650.0000 mg | ORAL_TABLET | ORAL | Status: DC | PRN
  Administered 2018-09-19 – 2018-09-20 (×2): 650 mg via ORAL
  Filled 2018-09-19 (×2): qty 2

## 2018-09-19 MED ORDER — SODIUM CHLORIDE 0.9% FLUSH: 3.0000 mL | Freq: Two times a day (BID) | INTRAVENOUS | Status: DC

## 2018-09-19 MED ORDER — LACTATED RINGERS IV SOLN: 500.0000 mL | Freq: Once | INTRAVENOUS | Status: DC

## 2018-09-19 NOTE — Anesthesia Procedure Notes (Signed)
Epidural Patient location during procedure: OB  Staffing Anesthesiologist: Lewie Loron, MD Performed: anesthesiologist   Preanesthetic Checklist Completed: patient identified, pre-op evaluation, timeout performed, IV checked, risks and benefits discussed and monitors and equipment checked  Epidural Patient position: sitting Prep: site prepped and draped and DuraPrep Patient monitoring: heart rate, continuous pulse ox and blood pressure Approach: midline Location: L2-L3 Injection technique: LOR air and LOR saline  Needle:  Needle type: Tuohy  Needle gauge: 17 G Needle length: 9 cm Needle insertion depth: 5 cm Catheter type: closed end flexible Catheter size: 19 Gauge Catheter at skin depth: 10 cm Test dose: negative  Assessment Sensory level: T8 Events: blood not aspirated, injection not painful, no injection resistance, negative IV test and no paresthesia  Additional Notes Significant leftward scoliosis.Reason for block:procedure for pain

## 2018-09-19 NOTE — Anesthesia Preprocedure Evaluation (Signed)
Anesthesia Evaluation  Patient identified by MRN, date of birth, ID band Patient awake    Reviewed: Allergy & Precautions, NPO status , Patient's Chart, lab work & pertinent test results  Airway Mallampati: II  TM Distance: >3 FB Neck ROM: Full    Dental no notable dental hx. (+) Dental Advisory Given   Pulmonary neg pulmonary ROS,    Pulmonary exam normal breath sounds clear to auscultation       Cardiovascular negative cardio ROS Normal cardiovascular exam Rhythm:Regular Rate:Normal     Neuro/Psych negative neurological ROS  negative psych ROS   GI/Hepatic negative GI ROS, Neg liver ROS,   Endo/Other  Hypothyroidism   Renal/GU Renal disease     Musculoskeletal negative musculoskeletal ROS (+)   Abdominal   Peds  Hematology negative hematology ROS (+)   Anesthesia Other Findings   Reproductive/Obstetrics (+) Pregnancy                             Anesthesia Physical Anesthesia Plan  ASA: II  Anesthesia Plan: Epidural   Post-op Pain Management:    Induction:   PONV Risk Score and Plan:   Airway Management Planned:   Additional Equipment:   Intra-op Plan:   Post-operative Plan:   Informed Consent: I have reviewed the patients History and Physical, chart, labs and discussed the procedure including the risks, benefits and alternatives for the proposed anesthesia with the patient or authorized representative who has indicated his/her understanding and acceptance.       Plan Discussed with:   Anesthesia Plan Comments:         Anesthesia Quick Evaluation

## 2018-09-19 NOTE — MAU Note (Signed)
Pt presents to MAU with complaints of contractions for three hours with bloody show

## 2018-09-19 NOTE — Lactation Note (Addendum)
This note was copied from a baby's chart. Lactation Consultation Note  Patient Name: Jenny Barnes   [name] Jenny Barnes now 6 hours old.  Parents report he has not breastfed since birth.  No Breastfeeding classes or education. Mom with hyperthyroidism and on meds.  Infant started cuing.  Assist mom with trying to latch him.  Mom with small, short shafted nipples.  Infant will open and go there but does not suck.  just comes right back off. Showed mom how to prepump with manual pump.  Used 24 mm flange.  After a few attempts infant fell back asleep.  Assist mom with doing some hand expression and spoon feeding. Able to get small drops and feed him back with spoon.  Left mom and baby STS.  Reviewed Understanding Mother and Baby, [facility] breastfeeding [facility], and breastfeeding resources. Room full of visitors.  Visitors reported it was fine to talk about breastfeeding and help mom too.  Mom in agreement. Left name and number on white board.  Urged to call lactation as needed.   Maternal Data    Feeding Feeding Type: Breast Fed  LATCH Score Latch: Too sleepy or reluctant, no latch achieved, no sucking elicited.  Audible Swallowing: None  Type of Nipple: Flat  Comfort (Breast/Nipple): Soft / non-tender  Hold (Positioning): Assistance needed to correctly position infant at breast and maintain latch.  LATCH Score: 4  Interventions    Lactation Tools Discussed/Used     Consult Status      Jenny Barnes Barnes, 2:33 PM

## 2018-09-19 NOTE — Anesthesia Postprocedure Evaluation (Signed)
Anesthesia Post Note  Patient: Jenny Barnes  Procedure(s) Performed: AN AD HOC LABOR EPIDURAL     Patient location during evaluation: Mother Baby Anesthesia Type: Epidural Level of consciousness: awake, awake and alert and oriented Pain management: pain level controlled Vital Signs Assessment: post-procedure vital signs reviewed and stable Respiratory status: spontaneous breathing and respiratory function stable Cardiovascular status: blood pressure returned to baseline Postop Assessment: no headache, no backache, epidural receding, patient able to bend at knees, no apparent nausea or vomiting, adequate PO intake and able to ambulate Anesthetic complications: no    Last Vitals:  Vitals:   09/19/18 0940 09/19/18 1100  BP: 111/75 114/80  Pulse: 62 72  Resp: 18   Temp: (!) 36.1 C (!) 36.1 C  SpO2: 100% 100%    Last Pain:  Vitals:   09/19/18 1257  TempSrc:   PainSc: 3    Pain Goal:                   Cleda Clarks

## 2018-09-20 LAB — CBC
HCT: 32.3 % — ABNORMAL LOW (ref 36.0–46.0)
Hemoglobin: 10.9 g/dL — ABNORMAL LOW (ref 12.0–15.0)
MCH: 31.5 pg (ref 26.0–34.0)
MCHC: 33.7 g/dL (ref 30.0–36.0)
MCV: 93.4 fL (ref 80.0–100.0)
Platelets: 191 10*3/uL (ref 150–400)
RBC: 3.46 MIL/uL — ABNORMAL LOW (ref 3.87–5.11)
RDW: 13.4 % (ref 11.5–15.5)
WBC: 14.9 10*3/uL — ABNORMAL HIGH (ref 4.0–10.5)
nRBC: 0 % (ref 0.0–0.2)

## 2018-09-20 MED ORDER — OXYCODONE-ACETAMINOPHEN 5-325 MG PO TABS
1.0000 | ORAL_TABLET | ORAL | Status: DC | PRN
  Administered 2018-09-20 – 2018-09-21 (×4): 1 via ORAL
  Filled 2018-09-20 (×4): qty 1

## 2018-09-20 NOTE — Lactation Note (Signed)
This note was copied from a baby's chart. Lactation Consultation Note  Patient Name: Jenny Barnes JHERD'E Date: 09/20/2018  [name] Clydene Pugh at 62 hours old.  Still not maintaining latch well. Assisted mom with latching him with 16 mm nipple shield.  Infant latched and breastfed well with rythmic sucking and a few audible swallows.  Colostrum and saliva in nipple shield when he came off.  Urged mom to pump with DEBP past breastfeedings and feed back all expressed breast milk.  Able to hand express small drops of colostrum.  Urged mom to do this prior to applying nipple shield.  Showed mom how to turn shield almost inside out and stretch it to stay.  Mom reports comfort.  Urged mom to try without nipple shield first and then if he will not latch to use nipple shield. Left mom and baby breastfeeding.  Urged dad and grandma to help keep him awake.Left name and number white board.  Urged mom to call lactation as needed.   Maternal Data    Feeding Feeding Type: Breast Fed  LATCH Score Latch: Repeated attempts needed to sustain latch, nipple held in mouth throughout feeding, stimulation needed to elicit sucking reflex.  Audible Swallowing: None  Type of Nipple: Everted at rest and after stimulation  Comfort (Breast/Nipple): Soft / non-tender  Hold (Positioning): Assistance needed to correctly position infant at breast and maintain latch.  LATCH Score: 6  Interventions Interventions: Breast feeding basics reviewed;Assisted with latch;Skin to skin;Hand express;Breast massage;Support pillows  Lactation Tools Discussed/Used     Consult Status      Neomia Dear 09/20/2018, 2:16 PM

## 2018-09-20 NOTE — Lactation Note (Signed)
This note was copied from a baby's chart. Lactation Consultation Note  Patient Name: Jenny Barnes Today's Date: 09/20/2018  Referral from RN that infant still not feeding well.  Mom report infant just had a good feeding and there was colostrum in the nipple shield.  Urged mom to continue to pump and hand express past breastfeeding and then feed all expressed mothers milk to infant.  Urged mom to follow up with lactation as needed. .    Maternal Data    Feeding    LATCH Score                   Interventions    Lactation Tools Discussed/Used     Consult Status      Abraham Margulies Michaelle Copas 09/20/2018, 8:06 PM

## 2018-09-20 NOTE — Progress Notes (Signed)
Patient is eating, ambulating, voiding.  Pain control is good.  Appropriate lochia, no complaints.  Vitals:   09/19/18 1827 09/19/18 2100 09/20/18 0053 09/20/18 0529  BP: 117/75 111/73 103/67 113/72  Pulse: 63 81 75 67  Resp: 18 18 18 18   Temp: 97.9 F (36.6 C) 98.1 F (36.7 C) 97.8 F (36.6 C) 97.7 F (36.5 C)  TempSrc: Oral Oral Oral Oral  SpO2: 100% 98% 99%   Weight:      Height:       Gen NAD Abd: soft,non-tender Fundus firm Ext: no calf tenderness  Lab Results  Component Value Date   WBC 14.9 (H) 09/20/2018   HGB 10.9 (L) 09/20/2018   HCT 32.3 (L) 09/20/2018   MCV 93.4 09/20/2018   PLT 191 09/20/2018    --/--/B POS, B POS Performed at [facility], [address], [place], [place] [address]  (01/23 0140)  A/P Post partum day 1. Circ outpatient  Routine care.   Philip Aspen

## 2018-09-21 NOTE — Lactation Note (Signed)
This note was copied from a baby's chart. Lactation Consultation Note  Patient Name: Jenny Barnes XFGHW'E Date: 09/21/2018 Reason for consult: Follow-up assessment;Term;Primapara;1st time breastfeeding  P1 mother whose infant is now 29 hours old.    Mother had no questions/concerns related to breast feeding.  She has not breast fed since yesterday.  Baby was sleeping in bassinet when I arrived.  Encouraged continuing to put baby to breast 8-12 times/24 hours or sooner if he shows feeding cues.  Reminded mother to do hand expression before/after feedings to help increase milk supply.  Suggested intermittent breast compressions when pumping.  Mother does not have a "hands free" bra or a sports bra.  Educated her on how to make a "hands free" bra if she wants to purchase a sports bra.  Engorgement prevention/treatment discussed.  Mother verbalized understanding.  Provided an additional #16 NS at mother's request.  Manual pump also provided with instructions for use.  Mother will call for any further questions prior to discharge.  She has our OP phone number and informed her of our breastfeeding support groups.    Family present and supportive.   Maternal Data Formula Feeding for Exclusion: No Has patient been taught Hand Expression?: Yes Does the patient have breastfeeding experience prior to this delivery?: No  Feeding Feeding Type: Bottle Fed - Formula Nipple Type: Slow - flow  LATCH Score                   Interventions    Lactation Tools Discussed/Used Tools: Shells;Pump;Nipple Shields Nipple shield size: 16 Shell Type: Inverted Breast pump type: Double-Electric Breast Pump;Manual WIC Program: No Pump Review: Setup, frequency, and cleaning Initiated by:: Georgina Krist Date initiated:: 09/21/18   Consult Status Consult Status: Complete Date: 09/21/18 Follow-up type: Call as needed    Gelena Klosinski R Kaycee Haycraft 09/21/2018, 10:37 AM

## 2018-09-21 NOTE — Discharge Summary (Signed)
Obstetric Discharge Summary Reason for Admission: onset of labor Prenatal Procedures: ultrasound Intrapartum Procedures: spontaneous vaginal delivery Postpartum Procedures: none Complications-Operative and Postpartum: 1st degree perineal laceration Hemoglobin  Date Value Ref Range Status  09/20/2018 10.9 (L) 12.0 - 15.0 g/dL Final   HCT  Date Value Ref Range Status  09/20/2018 32.3 (L) 36.0 - 46.0 % Final    Physical Exam:  General: alert and cooperative Lochia: appropriate Uterine Fundus: firm DVT Evaluation: No evidence of DVT seen on physical exam.  Discharge Diagnoses: Term Pregnancy-delivered  Discharge Information: Date: 09/21/2018 Activity: pelvic rest Diet: routine Medications: PNV and Ibuprofen Condition: stable Instructions: refer to practice specific booklet Discharge to: home Follow-up Information    Carrington ClampHorvath, Michelle, MD Follow up in 4 week(s).   Specialty:  Obstetrics and Gynecology Contact information: [address] [place] [id number] [phone]           Newborn Data: Live born female  Birth Weight: 6 lb 14.1 oz (3120 g) APGAR: 9, 9  Newborn Delivery   Birth date/time:  09/19/2018 07:49:00 Delivery type:  Vaginal, Spontaneous     Home with mother.  Jenny AspenSidney Cailin Barnes 09/21/2018, 5:30 PM

## 2018-09-21 NOTE — Progress Notes (Signed)
CSW received and acknowledges consult for EDPS of 9.  Consult screened out due to 9 on EDPS does not warrant a CSW consult.  MOB whom scores are greater than 9/yes to question 10 on [place] Postpartum Depression Screen warrants a CSW consult.   Jocie Meroney Boyd-Gilyard, MSW, LCSW Clinical Social Work [phone]  

## 2018-10-15 NOTE — H&P (Signed)
Jenny Barnes is a 23 y.o. female presenting for labor OB History    Gravida  1   Para  1   Term  1   Preterm      AB      Living  1     SAB      TAB      Ectopic      Multiple  0   Live Births  1          Past Medical History:  Diagnosis Date  . Hypothyroidism   . Thyroid disease    Past Surgical History:  Procedure Laterality Date  . WISDOM TOOTH EXTRACTION     Family History: family history includes Kidney Stones in her mother and sister. Social History:  reports that she has never smoked. She has never used smokeless tobacco. She reports that she does not drink alcohol or use drugs.     Maternal Diabetes: No Genetic Screening: Normal Maternal Ultrasounds/Referrals: Normal Fetal Ultrasounds or other Referrals:  None Maternal Substance Abuse:  No Significant Maternal Medications:  None Significant Maternal Lab Results:  None Other Comments:  None  ROS History Dilation: 10 Effacement (%): 80 Station: Plus 2 Exam by:: k fields, rn Blood pressure 100/70, pulse 68, temperature 98 F (36.7 C), temperature source Oral, resp. rate 18, height 5\' 2"  (1.575 m), weight 65.3 kg, SpO2 98 %, unknown if currently breastfeeding. Exam Physical Exam  Prenatal labs: ABO, Rh: --/--/B POS, B POS Performed at [facility], [address], [place], [place] [address]  [id number]/23 0140) Antibody: NEG (01/23 0140) Rubella: Immune (07/17 0000) RPR: Non Reactive (01/23 0140)  HBsAg: Negative (07/17 0000)  HIV: Non-reactive (07/17 0000)  GBS: Negative (01/03 0000)   Assessment/Plan: 1) Admit 2) Episdural on request 3) anticipate svd   Waynard Reeds 10/15/2018, 11:38 AM

## 2019-04-17 DIAGNOSIS — M6281 Muscle weakness (generalized): Secondary | ICD-10-CM | POA: Diagnosis not present

## 2019-04-17 DIAGNOSIS — M25561 Pain in right knee: Secondary | ICD-10-CM | POA: Diagnosis not present

## 2019-04-28 DIAGNOSIS — M6281 Muscle weakness (generalized): Secondary | ICD-10-CM | POA: Diagnosis not present

## 2019-04-28 DIAGNOSIS — M25561 Pain in right knee: Secondary | ICD-10-CM | POA: Diagnosis not present

## 2019-08-29 NOTE — L&D Delivery Note (Signed)
Delivery Note Marybeth Dandy is a G2P1001 at [redacted]w[redacted]d who had a spontaneous delivery at 14:04 on 06/14/20 a viable female "Synetta Shadow" was delivered via OP.  APGAR: 9, 9; weight 2895g (6lb6.1oz)  Jameeka Khrystyna Schwalm was admitted for labor. On admission SVE was 6.5cm, received an epidural and then SROM'ed. Progressed rapidly. Pushed for 20 minutes. Baby was delivered without difficulty. No nuchal cord. Baby placed on maternal abdomen. Delayed cord clamping for 60 seconds. Delivery of placenta was spontaneous. Placenta was found to be intact, 3-vessel cord was noted. The fundus was found to be firm. A red robin was used to relieve the bladder. Patient received IM pitocin because she lost IV access during delivery. No lacerations. Estimated blood loss 50cc. Instrument and gauze counts were correct at the end of the procedure. Placenta status: L&D Mom to postpartum.  Baby to Couplet care / Skin to Skin.  Ayisha Pol K Taam-Akelman 06/14/2020, 2:20 PM

## 2019-12-02 LAB — OB RESULTS CONSOLE HIV ANTIBODY (ROUTINE TESTING): HIV: NONREACTIVE

## 2019-12-02 LAB — OB RESULTS CONSOLE RUBELLA ANTIBODY, IGM: Rubella: IMMUNE

## 2019-12-02 LAB — OB RESULTS CONSOLE RPR: RPR: NONREACTIVE

## 2019-12-02 LAB — OB RESULTS CONSOLE ABO/RH: RH Type: POSITIVE

## 2019-12-02 LAB — OB RESULTS CONSOLE GC/CHLAMYDIA
Chlamydia: NEGATIVE
Gonorrhea: NEGATIVE

## 2019-12-02 LAB — OB RESULTS CONSOLE ANTIBODY SCREEN: Antibody Screen: NEGATIVE

## 2019-12-02 LAB — OB RESULTS CONSOLE HEPATITIS B SURFACE ANTIGEN: Hepatitis B Surface Ag: NEGATIVE

## 2020-03-08 ENCOUNTER — Encounter (HOSPITAL_COMMUNITY): Payer: Self-pay | Admitting: Obstetrics

## 2020-03-08 ENCOUNTER — Other Ambulatory Visit: Payer: Self-pay

## 2020-03-08 ENCOUNTER — Inpatient Hospital Stay (HOSPITAL_COMMUNITY)
Admission: AD | Admit: 2020-03-08 | Discharge: 2020-03-08 | Disposition: A | Discharge status: Home / Self Care | Payer: Medicaid Other | Attending: Obstetrics | Admitting: Obstetrics

## 2020-03-08 DIAGNOSIS — O99282 Endocrine, nutritional and metabolic diseases complicating pregnancy, second trimester: Secondary | ICD-10-CM | POA: Diagnosis not present

## 2020-03-08 DIAGNOSIS — O368120 Decreased fetal movements, second trimester, not applicable or unspecified: Secondary | ICD-10-CM

## 2020-03-08 DIAGNOSIS — O368130 Decreased fetal movements, third trimester, not applicable or unspecified: Secondary | ICD-10-CM | POA: Diagnosis not present

## 2020-03-08 DIAGNOSIS — Z79890 Hormone replacement therapy (postmenopausal): Secondary | ICD-10-CM | POA: Diagnosis not present

## 2020-03-08 DIAGNOSIS — Z3A25 25 weeks gestation of pregnancy: Secondary | ICD-10-CM

## 2020-03-08 DIAGNOSIS — E039 Hypothyroidism, unspecified: Secondary | ICD-10-CM | POA: Insufficient documentation

## 2020-03-08 LAB — URINALYSIS, ROUTINE W REFLEX MICROSCOPIC
Bilirubin Urine: NEGATIVE
Glucose, UA: NEGATIVE mg/dL
Ketones, ur: NEGATIVE mg/dL
Nitrite: NEGATIVE
Protein, ur: NEGATIVE mg/dL
Specific Gravity, Urine: 1.023 (ref 1.005–1.030)
pH: 6 (ref 5.0–8.0)

## 2020-03-08 NOTE — Discharge Instructions (Signed)
Fetal Movement Counts Patient Name: ________________________________________________ Patient Due Date: ____________________ What is a fetal movement count?  A fetal movement count is the number of times that you feel your baby move during a certain amount of time. This may also be called a fetal kick count. A fetal movement count is recommended for every pregnant woman. You may be asked to start counting fetal movements as early as week 28 of your pregnancy. Pay attention to when your baby is most active. You may notice your baby's sleep and wake cycles. You may also notice things that make your baby move more. You should do a fetal movement count:  When your baby is normally most active.  At the same time each day. A good time to count movements is while you are resting, after having something to eat and drink. How do I count fetal movements? 1. Find a quiet, comfortable area. Sit, or lie down on your side. 2. Write down the date, the start time and stop time, and the number of movements that you felt between those two times. Take this information with you to your health care visits. 3. Write down your start time when you feel the first movement. 4. Count kicks, flutters, swishes, rolls, and jabs. You should feel at least 10 movements. 5. You may stop counting after you have felt 10 movements, or if you have been counting for 2 hours. Write down the stop time. 6. If you do not feel 10 movements in 2 hours, contact your health care provider for further instructions. Your health care provider may want to do additional tests to assess your baby's well-being. Contact a health care provider if:  You feel fewer than 10 movements in 2 hours.  Your baby is not moving like he or she usually does. Date: ____________ Start time: ____________ Stop time: ____________ Movements: ____________ Date: ____________ Start time: ____________ Stop time: ____________ Movements: ____________ Date: ____________  Start time: ____________ Stop time: ____________ Movements: ____________ Date: ____________ Start time: ____________ Stop time: ____________ Movements: ____________ Date: ____________ Start time: ____________ Stop time: ____________ Movements: ____________ Date: ____________ Start time: ____________ Stop time: ____________ Movements: ____________ Date: ____________ Start time: ____________ Stop time: ____________ Movements: ____________ Date: ____________ Start time: ____________ Stop time: ____________ Movements: ____________ Date: ____________ Start time: ____________ Stop time: ____________ Movements: ____________ This information is not intended to replace advice given to you by your health care provider. Make sure you discuss any questions you have with your health care provider. Document Revised: 04/03/2019 Document Reviewed: 04/03/2019 Elsevier Patient Education  2020 [employer] Preterm Labor and Birth Information  The normal length of a pregnancy is 39-41 weeks. Preterm labor is when labor starts before 37 completed weeks of pregnancy. What are the risk factors for preterm labor? Preterm labor is more likely to occur in women who:  Have certain infections during pregnancy such as a bladder infection, sexually transmitted infection, or infection inside the uterus (chorioamnionitis).  Have a shorter-than-normal cervix.  Have gone into preterm labor before.  Have had surgery on their cervix.  Are younger than age 17 or older than age 35.  Are African American.  Are pregnant with twins or multiple babies (multiple gestation).  Take street drugs or smoke while pregnant.  Do not gain enough weight while pregnant.  Became pregnant shortly after having been pregnant. What are the symptoms of preterm labor? Symptoms of preterm labor include:  Cramps similar to those that can happen during a menstrual period. The   cramps may happen with diarrhea.  Pain in the abdomen or lower  back.  Regular uterine contractions that may feel like tightening of the abdomen.  A feeling of increased pressure in the pelvis.  Increased watery or bloody mucus discharge from the vagina.  Water breaking (ruptured amniotic sac). Why is it important to recognize signs of preterm labor? It is important to recognize signs of preterm labor because babies who are born prematurely may not be fully developed. This can put them at an increased risk for:  Long-term (chronic) heart and lung problems.  Difficulty immediately after birth with regulating body systems, including blood sugar, body temperature, heart rate, and breathing rate.  Bleeding in the brain.  Cerebral palsy.  Learning difficulties.  Death. These risks are highest for babies who are born before 34 weeks of pregnancy. How is preterm labor treated? Treatment depends on the length of your pregnancy, your condition, and the health of your baby. It may involve:  Having a stitch (suture) placed in your cervix to prevent your cervix from opening too early (cerclage).  Taking or being given medicines, such as: ? Hormone medicines. These may be given early in pregnancy to help support the pregnancy. ? Medicine to stop contractions. ? Medicines to help mature the baby's lungs. These may be prescribed if the risk of delivery is high. ? Medicines to prevent your baby from developing cerebral palsy. If the labor happens before 34 weeks of pregnancy, you may need to stay in the hospital. What should I do if I think I am in preterm labor? If you think that you are going into preterm labor, call your health care provider right away. How can I prevent preterm labor in future pregnancies? To increase your chance of having a full-term pregnancy:  Do not use any tobacco products, such as cigarettes, chewing tobacco, and e-cigarettes. If you need help quitting, ask your health care provider.  Do not use street drugs or medicines that  have not been prescribed to you during your pregnancy.  Talk with your health care provider before taking any herbal supplements, even if you have been taking them regularly.  Make sure you gain a healthy amount of weight during your pregnancy.  Watch for infection. If you think that you might have an infection, get it checked right away.  Make sure to tell your health care provider if you have gone into preterm labor before. This information is not intended to replace advice given to you by your health care provider. Make sure you discuss any questions you have with your health care provider. Document Revised: 12/06/2018 Document Reviewed: 01/05/2016 Elsevier Patient Education  2020 Elsevier Inc.  

## 2020-03-08 NOTE — MAU Provider Note (Signed)
History     CSN: [id number]  Arrival date and time: 03/08/20 2051   First Provider Initiated Contact with Patient 03/08/20 2128      Chief Complaint  Patient presents with  . Decreased Fetal Movement   [name] is a 24 y.o. G2P1001 at [redacted]w[redacted]d who presents to MAU for DFM since Thursday. Patient reports she called her OB and was told to come in for evaluation. Patient reports normal movement since coming to MAU.   OB History    Gravida  2   Para  1   Term  1   Preterm      AB      Living  1     SAB      TAB      Ectopic      Multiple  0   Live Births  1           Past Medical History:  Diagnosis Date  . Hypothyroidism   . Thyroid disease     Past Surgical History:  Procedure Laterality Date  . WISDOM TOOTH EXTRACTION      Family History  Problem Relation Age of Onset  . Kidney Stones Mother   . Kidney Stones Sister     Social History   Tobacco Use  . Smoking status: Never Smoker  . Smokeless tobacco: Never Used  Vaping Use  . Vaping Use: Never used  Substance Use Topics  . Alcohol use: No  . Drug use: Never    Allergies: No Known Allergies  Medications Prior to Admission  Medication Sig Dispense Refill Last Dose  . levothyroxine (SYNTHROID) 200 MCG tablet Take 200 mcg by mouth daily before breakfast.     . Prenatal Vit-Fe Fumarate-FA (PRENATAL MULTIVITAMIN) TABS tablet Take 1 tablet by mouth daily at 12 noon.   03/08/2020 at Unknown time  . famotidine (PEPCID) 20 MG tablet Take 40 mg by mouth at bedtime.     Marland Kitchen levothyroxine (SYNTHROID, LEVOTHROID) 150 MCG tablet Take 150 mcg by mouth daily before breakfast. Combines with 25 mcg tablet for a total dose of 175 mcg.     . levothyroxine (SYNTHROID, LEVOTHROID) 25 MCG tablet Take 25 mcg by mouth daily before breakfast. Combines with 150 mcg tablet for a total dose of 175 mcg.       Review of Systems  Constitutional: Negative for chills, diaphoresis, fatigue and fever.   Eyes: Negative for visual disturbance.  Respiratory: Negative for shortness of breath.   Cardiovascular: Negative for chest pain.  Gastrointestinal: Negative for abdominal pain, constipation, diarrhea, nausea and vomiting.  Genitourinary: Negative for dysuria, flank pain, frequency, pelvic pain, urgency, vaginal bleeding and vaginal discharge.  Neurological: Negative for dizziness, weakness, light-headedness and headaches.   Physical Exam   Blood pressure (!) 105/56, pulse 88, temperature 98.2 F (36.8 C), resp. rate 18, unknown if currently breastfeeding.  Patient Vitals for the past 24 hrs:  BP Temp Pulse Resp  03/08/20 2121 (!) 105/56 98.2 F (36.8 C) 88 18   Physical Exam Constitutional:      General: She is not in acute distress.    Appearance: She is well-developed. She is not diaphoretic.  HENT:     Head: Normocephalic and atraumatic.  Pulmonary:     Effort: Pulmonary effort is normal.  Abdominal:     Palpations: Abdomen is soft.  Skin:    General: Skin is warm and dry.  Neurological:     Mental Status: She is  alert and oriented to person, place, and time.  Psychiatric:        Behavior: Behavior normal.        Thought Content: Thought content normal.        Judgment: Judgment normal.    MAU Course  Procedures  MDM -DFM since Thursday -return of normal movement while in MAU -clicker given to patient to record fetal movement, patient pressed button 25 times in 20 minutes -EFM: recative       -baseline: 150       -variability: moderate       -accels: present, 10x10       -decels: absent       -TOCO: quiet -pt discharged to home in stable condition  Assessment and Plan   1. Decreased fetal movements in third trimester, single or unspecified fetus   2. [redacted] weeks gestation of pregnancy     Allergies as of 03/08/2020   No Known Allergies     Medication List    TAKE these medications   famotidine 20 MG tablet Commonly known as: PEPCID Take 40 mg by  mouth at bedtime.   levothyroxine 150 MCG tablet Commonly known as: SYNTHROID Take 150 mcg by mouth daily before breakfast. Combines with 25 mcg tablet for a total dose of 175 mcg.   levothyroxine 25 MCG tablet Commonly known as: SYNTHROID Take 25 mcg by mouth daily before breakfast. Combines with 150 mcg tablet for a total dose of 175 mcg.   levothyroxine 200 MCG tablet Commonly known as: SYNTHROID Take 200 mcg by mouth daily before breakfast.   prenatal multivitamin Tabs tablet Take 1 tablet by mouth daily at 12 noon.       -discussed that mothers do not perceive 100% of fetal movement, and there is wide variation of mother perception of fetal movement -discussed that fetal movement varies somewhat depending on the time of day and gestational age and there is a wide variety of normal movement among healthy babies -discussed that patient should not expect regular movement until 28wks -discussed that frequency of movement typically increases from morning to night, with peak activity late at night -discussed that fetal sleep cycles become longer with advancing gestation and can last about 20-67minutes -pt advised to contact HCP immediately if noticing a decrease in fetal movement compared to what is normal -discussed FKCs vs. monitoring movements; can either do fetal kick counting, or just be aware of what is normal for baby in terms of movement; no one method is better than the other -FKC: at least 10 fetal movements (FMs) over up to two hours when at rest and focused on counting -return MAU precautions given -pt discharged to home in stable condition  Joni Reining E Jenny Barnes 03/08/2020, 9:42 PM

## 2020-03-08 NOTE — MAU Note (Signed)
Pt reports to MAU stating she has not felt FM since Thursday. Pt reports she has a anterior placenta. Pt denies bleeding or LOF. Pt reports some belly pain at her umbilicus and her rib cage.

## 2020-04-05 ENCOUNTER — Encounter (HOSPITAL_COMMUNITY): Payer: Self-pay | Admitting: Obstetrics & Gynecology

## 2020-04-05 ENCOUNTER — Inpatient Hospital Stay (HOSPITAL_COMMUNITY)
Admission: AD | Admit: 2020-04-05 | Discharge: 2020-04-14 | DRG: 818 | Disposition: A | Discharge status: Home / Self Care | Payer: Medicaid Other | Attending: Obstetrics and Gynecology | Admitting: Obstetrics and Gynecology

## 2020-04-05 ENCOUNTER — Other Ambulatory Visit: Payer: Self-pay

## 2020-04-05 ENCOUNTER — Inpatient Hospital Stay (HOSPITAL_COMMUNITY): Payer: Medicaid Other

## 2020-04-05 ENCOUNTER — Inpatient Hospital Stay (EMERGENCY_DEPARTMENT_HOSPITAL)
Admission: AD | Admit: 2020-04-05 | Discharge: 2020-04-05 | Disposition: A | Discharge status: Home / Self Care | Payer: Medicaid Other | Source: Home / Self Care | Attending: Obstetrics & Gynecology | Admitting: Obstetrics & Gynecology

## 2020-04-05 ENCOUNTER — Encounter (HOSPITAL_COMMUNITY): Payer: Self-pay | Admitting: Obstetrics and Gynecology

## 2020-04-05 DIAGNOSIS — N200 Calculus of kidney: Secondary | ICD-10-CM | POA: Diagnosis not present

## 2020-04-05 DIAGNOSIS — M79605 Pain in left leg: Secondary | ICD-10-CM | POA: Diagnosis not present

## 2020-04-05 DIAGNOSIS — Z20822 Contact with and (suspected) exposure to covid-19: Secondary | ICD-10-CM | POA: Diagnosis present

## 2020-04-05 DIAGNOSIS — M7918 Myalgia, other site: Secondary | ICD-10-CM | POA: Diagnosis not present

## 2020-04-05 DIAGNOSIS — O99891 Other specified diseases and conditions complicating pregnancy: Secondary | ICD-10-CM

## 2020-04-05 DIAGNOSIS — O26893 Other specified pregnancy related conditions, third trimester: Secondary | ICD-10-CM | POA: Insufficient documentation

## 2020-04-05 DIAGNOSIS — E039 Hypothyroidism, unspecified: Secondary | ICD-10-CM | POA: Insufficient documentation

## 2020-04-05 DIAGNOSIS — Z0371 Encounter for suspected problem with amniotic cavity and membrane ruled out: Secondary | ICD-10-CM

## 2020-04-05 DIAGNOSIS — Z79899 Other long term (current) drug therapy: Secondary | ICD-10-CM | POA: Insufficient documentation

## 2020-04-05 DIAGNOSIS — Z3A29 29 weeks gestation of pregnancy: Secondary | ICD-10-CM

## 2020-04-05 DIAGNOSIS — Z3689 Encounter for other specified antenatal screening: Secondary | ICD-10-CM

## 2020-04-05 DIAGNOSIS — D649 Anemia, unspecified: Secondary | ICD-10-CM | POA: Diagnosis present

## 2020-04-05 DIAGNOSIS — R531 Weakness: Secondary | ICD-10-CM | POA: Diagnosis not present

## 2020-04-05 DIAGNOSIS — M79604 Pain in right leg: Secondary | ICD-10-CM | POA: Diagnosis not present

## 2020-04-05 DIAGNOSIS — O26833 Pregnancy related renal disease, third trimester: Secondary | ICD-10-CM

## 2020-04-05 DIAGNOSIS — M549 Dorsalgia, unspecified: Secondary | ICD-10-CM | POA: Insufficient documentation

## 2020-04-05 DIAGNOSIS — Z79890 Hormone replacement therapy (postmenopausal): Secondary | ICD-10-CM | POA: Insufficient documentation

## 2020-04-05 DIAGNOSIS — O99013 Anemia complicating pregnancy, third trimester: Secondary | ICD-10-CM | POA: Diagnosis present

## 2020-04-05 DIAGNOSIS — N132 Hydronephrosis with renal and ureteral calculous obstruction: Secondary | ICD-10-CM | POA: Diagnosis present

## 2020-04-05 DIAGNOSIS — O368130 Decreased fetal movements, third trimester, not applicable or unspecified: Secondary | ICD-10-CM | POA: Insufficient documentation

## 2020-04-05 DIAGNOSIS — O99283 Endocrine, nutritional and metabolic diseases complicating pregnancy, third trimester: Secondary | ICD-10-CM | POA: Insufficient documentation

## 2020-04-05 DIAGNOSIS — R109 Unspecified abdominal pain: Secondary | ICD-10-CM | POA: Insufficient documentation

## 2020-04-05 DIAGNOSIS — Z419 Encounter for procedure for purposes other than remedying health state, unspecified: Secondary | ICD-10-CM

## 2020-04-05 DIAGNOSIS — G5710 Meralgia paresthetica, unspecified lower limb: Secondary | ICD-10-CM | POA: Diagnosis not present

## 2020-04-05 LAB — CBC WITH DIFFERENTIAL/PLATELET
Abs Immature Granulocytes: 0.03 10*3/uL (ref 0.00–0.07)
Basophils Absolute: 0 10*3/uL (ref 0.0–0.1)
Basophils Relative: 0 %
Eosinophils Absolute: 0.2 10*3/uL (ref 0.0–0.5)
Eosinophils Relative: 2 %
HCT: 32.3 % — ABNORMAL LOW (ref 36.0–46.0)
Hemoglobin: 10.8 g/dL — ABNORMAL LOW (ref 12.0–15.0)
Immature Granulocytes: 0 %
Lymphocytes Relative: 18 %
Lymphs Abs: 1.6 10*3/uL (ref 0.7–4.0)
MCH: 30.7 pg (ref 26.0–34.0)
MCHC: 33.4 g/dL (ref 30.0–36.0)
MCV: 91.8 fL (ref 80.0–100.0)
Monocytes Absolute: 0.7 10*3/uL (ref 0.1–1.0)
Monocytes Relative: 8 %
Neutro Abs: 6.3 10*3/uL (ref 1.7–7.7)
Neutrophils Relative %: 72 %
Platelets: 179 10*3/uL (ref 150–400)
RBC: 3.52 MIL/uL — ABNORMAL LOW (ref 3.87–5.11)
RDW: 12.7 % (ref 11.5–15.5)
WBC: 8.8 10*3/uL (ref 4.0–10.5)
nRBC: 0 % (ref 0.0–0.2)

## 2020-04-05 LAB — COMPREHENSIVE METABOLIC PANEL
ALT: 11 U/L (ref 0–44)
AST: 14 U/L — ABNORMAL LOW (ref 15–41)
Albumin: 2.9 g/dL — ABNORMAL LOW (ref 3.5–5.0)
Alkaline Phosphatase: 39 U/L (ref 38–126)
Anion gap: 8 (ref 5–15)
BUN: 10 mg/dL (ref 6–20)
CO2: 22 mmol/L (ref 22–32)
Calcium: 8.3 mg/dL — ABNORMAL LOW (ref 8.9–10.3)
Chloride: 102 mmol/L (ref 98–111)
Creatinine, Ser: 1.05 mg/dL — ABNORMAL HIGH (ref 0.44–1.00)
GFR calc Af Amer: >60 mL/min (ref >60)
GFR calc non Af Amer: >60 mL/min (ref >60)
Glucose, Bld: 87 mg/dL (ref 70–99)
Potassium: 3.5 mmol/L (ref 3.5–5.1)
Sodium: 132 mmol/L — ABNORMAL LOW (ref 135–145)
Total Bilirubin: 0.5 mg/dL (ref 0.3–1.2)
Total Protein: 5.9 g/dL — ABNORMAL LOW (ref 6.5–8.1)

## 2020-04-05 LAB — URINALYSIS, ROUTINE W REFLEX MICROSCOPIC
Bilirubin Urine: NEGATIVE
Bilirubin Urine: NEGATIVE
Glucose, UA: NEGATIVE mg/dL
Glucose, UA: NEGATIVE mg/dL
Hgb urine dipstick: NEGATIVE
Hgb urine dipstick: NEGATIVE
Ketones, ur: NEGATIVE mg/dL
Ketones, ur: NEGATIVE mg/dL
Leukocytes,Ua: NEGATIVE
Nitrite: NEGATIVE
Nitrite: NEGATIVE
Protein, ur: NEGATIVE mg/dL
Protein, ur: NEGATIVE mg/dL
Specific Gravity, Urine: 1.006 (ref 1.005–1.030)
Specific Gravity, Urine: 1.025 (ref 1.005–1.030)
pH: 7 (ref 5.0–8.0)
pH: 6 (ref 5.0–8.0)

## 2020-04-05 LAB — TYPE AND SCREEN
ABO/RH(D): B POS
Antibody Screen: NEGATIVE

## 2020-04-05 LAB — WET PREP, GENITAL
Clue Cells Wet Prep HPF POC: NONE SEEN
Sperm: NONE SEEN
Trich, Wet Prep: NONE SEEN
Yeast Wet Prep HPF POC: NONE SEEN

## 2020-04-05 LAB — GC/CHLAMYDIA PROBE AMP (~~LOC~~) NOT AT ARMC
Chlamydia: NEGATIVE
Comment: NEGATIVE
Comment: NORMAL
Neisseria Gonorrhea: NEGATIVE

## 2020-04-05 MED ORDER — CYCLOBENZAPRINE HCL 5 MG PO TABS: 5.0000 mg | ORAL_TABLET | Freq: Three times a day (TID) | ORAL | 0 refills | Status: DC | PRN

## 2020-04-05 MED ORDER — TAMSULOSIN HCL 0.4 MG PO CAPS
0.4000 mg | ORAL_CAPSULE | Freq: Every day | ORAL | Status: DC
  Administered 2020-04-06 – 2020-04-13 (×6): 0.4 mg via ORAL
  Filled 2020-04-05 (×9): qty 1

## 2020-04-05 MED ORDER — ACETAMINOPHEN 325 MG PO TABS
650.0000 mg | ORAL_TABLET | ORAL | Status: DC | PRN
  Administered 2020-04-10: 650 mg via ORAL
  Filled 2020-04-05: qty 2

## 2020-04-05 MED ORDER — ZOLPIDEM TARTRATE 5 MG PO TABS
5.0000 mg | ORAL_TABLET | Freq: Every evening | ORAL | Status: DC | PRN
  Administered 2020-04-11: 5 mg via ORAL
  Filled 2020-04-05: qty 1

## 2020-04-05 MED ORDER — LACTATED RINGERS IV SOLN: INTRAVENOUS | Status: DC

## 2020-04-05 MED ORDER — IOHEXOL 300 MG/ML  SOLN
100.0000 mL | Freq: Once | INTRAMUSCULAR | Status: AC | PRN
  Administered 2020-04-05: 100 mL via INTRAVENOUS

## 2020-04-05 MED ORDER — CYCLOBENZAPRINE HCL 5 MG PO TABS
10.0000 mg | ORAL_TABLET | Freq: Once | ORAL | Status: AC
  Administered 2020-04-05: 10 mg via ORAL
  Filled 2020-04-05: qty 2

## 2020-04-05 MED ORDER — CALCIUM CARBONATE ANTACID 500 MG PO CHEW: 2.0000 | CHEWABLE_TABLET | ORAL | Status: DC | PRN

## 2020-04-05 MED ORDER — DOCUSATE SODIUM 100 MG PO CAPS
100.0000 mg | ORAL_CAPSULE | Freq: Every day | ORAL | Status: DC
  Administered 2020-04-07 – 2020-04-14 (×6): 100 mg via ORAL
  Filled 2020-04-05 (×7): qty 1

## 2020-04-05 MED ORDER — OXYCODONE-ACETAMINOPHEN 5-325 MG PO TABS
2.0000 | ORAL_TABLET | Freq: Once | ORAL | Status: AC
  Administered 2020-04-05: 2 via ORAL
  Filled 2020-04-05: qty 2

## 2020-04-05 MED ORDER — PRENATAL MULTIVITAMIN CH
1.0000 | ORAL_TABLET | Freq: Every day | ORAL | Status: DC
  Administered 2020-04-06 – 2020-04-13 (×6): 1 via ORAL
  Filled 2020-04-05 (×6): qty 1

## 2020-04-05 MED ORDER — LACTATED RINGERS IV SOLN
INTRAVENOUS | Status: DC
  Administered 2020-04-07 – 2020-04-08 (×2): 75 mL/h via INTRAVENOUS
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
## 2020-04-05 NOTE — Discharge Instructions (Signed)
Safe Medications in Pregnancy   Acne: Benzoyl Peroxide Salicylic Acid  Backache/Headache: Tylenol: 2 regular strength every 4 hours OR              2 Extra strength every 6 hours  Colds/Coughs/Allergies: Benadryl (alcohol free) 25 mg every 6 hours as needed Breath right strips Claritin Cepacol throat lozenges Chloraseptic throat spray Cold-Eeze- up to three times per day Cough drops, alcohol free Flonase (by prescription only) Guaifenesin Mucinex Robitussin DM (plain only, alcohol free) Saline nasal spray/drops Sudafed (pseudoephedrine) & Actifed ** use only after [redacted] weeks gestation and if you do not have high blood pressure Tylenol Vicks Vaporub Zinc lozenges Zyrtec   Constipation: Colace Ducolax suppositories Fleet enema Glycerin suppositories Metamucil Milk of magnesia Miralax Senokot Smooth move tea  Diarrhea: Kaopectate Imodium A-D  *NO pepto Bismol  Hemorrhoids: Anusol Anusol HC Preparation H Tucks  Indigestion: Tums Maalox Mylanta Zantac  Pepcid  Insomnia: Benadryl (alcohol free) 25mg  every 6 hours as needed Tylenol PM Unisom, no Gelcaps  Leg Cramps: Tums MagGel  Nausea/Vomiting:  Bonine Dramamine Emetrol Ginger extract Sea bands Meclizine  Nausea medication to take during pregnancy:  Unisom (doxylamine succinate 25 mg tablets) Take one tablet daily at bedtime. If symptoms are not adequately controlled, the dose can be increased to a maximum recommended dose of two tablets daily (1/2 tablet in the morning, 1/2 tablet mid-afternoon and one at bedtime). Vitamin B6 100mg  tablets. Take one tablet twice a day (up to 200 mg per day).  Skin Rashes: Aveeno products Benadryl cream or 25mg  every 6 hours as needed Calamine Lotion 1% cortisone cream  Yeast infection: Gyne-lotrimin 7 Monistat 7   **If taking multiple medications, please check labels to avoid duplicating the same active ingredients **take medication as directed on  the label ** Do not exceed 4000 mg of tylenol in 24 hours **Do not take medications that contain aspirin or ibuprofen     Back Pain in Pregnancy Back pain during pregnancy is common. Back pain may be caused by several factors that are related to changes during your pregnancy. Follow these instructions at home: Managing pain, stiffness, and swelling      If directed, for sudden (acute) back pain, put ice on the painful area. ? Put ice in a plastic bag. ? Place a towel between your skin and the bag. ? Leave the ice on for 20 minutes, 2-3 times per day.  If directed, apply heat to the affected area before you exercise. Use the heat source that your health care provider recommends, such as a moist heat pack or a heating pad. ? Place a towel between your skin and the heat source. ? Leave the heat on for 20-30 minutes. ? Remove the heat if your skin turns bright red. This is especially important if you are unable to feel pain, heat, or cold. You may have a greater risk of getting burned.  If directed, massage the affected area. Activity  Exercise as told by your health care provider. Gentle exercise is the best way to prevent or manage back pain.  Listen to your body when lifting. If lifting hurts, ask for help or bend your knees. This uses your leg muscles instead of your back muscles.  Squat down when picking up something from the floor. Do not bend over.  Only use bed rest for short periods as told by your health care provider. Bed rest should only be used for the most severe episodes of back pain. Standing, sitting,  and lying down  Do not stand in one place for long periods of time.  Use good posture when sitting. Make sure your head rests over your shoulders and is not hanging forward. Use a pillow on your lower back if necessary.  Try sleeping on your side, preferably the left side, with a pregnancy support pillow or 1-2 regular pillows between your legs. ? If you have back  pain after a night's rest, your bed may be too soft. ? A firm mattress may provide more support for your back during pregnancy. General instructions  Do not wear high heels.  Eat a healthy diet. Try to gain weight within your health care provider's recommendations.  Use a maternity girdle, elastic sling, or back brace as told by your health care provider.  Take over-the-counter and prescription medicines only as told by your health care provider.  Work with a physical therapist or massage therapist to find ways to manage back pain. Acupuncture or massage therapy may be helpful.  Keep all follow-up visits as told by your health care provider. This is important. Contact a health care provider if:  Your back pain interferes with your daily activities.  You have increasing pain in other parts of your body. Get help right away if:  You develop numbness, tingling, weakness, or problems with the use of your arms or legs.  You develop severe back pain that is not controlled with medicine.  You have a change in bowel or bladder control.  You develop shortness of breath, dizziness, or you faint.  You develop nausea, vomiting, or sweating.  You have back pain that is a rhythmic, cramping pain similar to labor pains. Labor pain is usually 1-2 minutes apart, lasts for about 1 minute, and involves a bearing down feeling or pressure in your pelvis.  You have back pain and your water breaks or you have vaginal bleeding.  You have back pain or numbness that travels down your leg.  Your back pain developed after you fell.  You develop pain on one side of your back.  You see blood in your urine.  You develop skin blisters in the area of your back pain. Summary  Back pain may be caused by several factors that are related to changes during your pregnancy.  Follow instructions as told by your health care provider for managing pain, stiffness, and swelling.  Exercise as told by your  health care provider. Gentle exercise is the best way to prevent or manage back pain.  Take over-the-counter and prescription medicines only as told by your health care provider.  Keep all follow-up visits as told by your health care provider. This is important. This information is not intended to replace advice given to you by your health care provider. Make sure you discuss any questions you have with your health care provider. Document Revised: 12/03/2018 Document Reviewed: 01/30/2018 Elsevier Patient Education  2020 [employer].
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
## 2020-04-05 NOTE — MAU Note (Signed)
Presents with c/o lower back pain that's not relieved with heat, Tylenol or Flexeril, or rest.  Reports was seen this morning in MAU, but pain has continued to progress and worsen.  Denies LOF or VB.  Endorses +FM.

## 2020-04-05 NOTE — H&P (Addendum)
Jenny Barnes is a 24 y.o. female presenting for severe right flank pain  24 yo G2P1001 @ 29+ presents to MAU for the second consecutive day c/o severe low back pain and right sided pain. She was seen yesterday with onset of low back pain after being on a boat and was thought to have MSK pain. THroughout today the pain has worsened and become more right sided. + CVA tenderness. In MAU, pt noted to be in severe pain. CT stone study shows severe right hydronephrosis with 58mm stone at UVJ.  OB History    Gravida  2   Para  1   Term  1   Preterm      AB      Living  1     SAB      TAB      Ectopic      Multiple  0   Live Births  1          Past Medical History:  Diagnosis Date  . Hypothyroidism   . Thyroid disease    Past Surgical History:  Procedure Laterality Date  . WISDOM TOOTH EXTRACTION     Family History: family history includes Kidney Stones in her mother and sister. Social History:  reports that she has never smoked. She has never used smokeless tobacco. She reports that she does not drink alcohol and does not use drugs.     Maternal Diabetes: No Genetic Screening: Normal Maternal Ultrasounds/Referrals: Normal Fetal Ultrasounds or other Referrals:  None Maternal Substance Abuse:  No Significant Maternal Medications:  Meds include: Syntroid Significant Maternal Lab Results:  None Other Comments:  None  Review of Systems History   Blood pressure 113/71, pulse 98, temperature 98.2 F (36.8 C), temperature source Oral, resp. rate 20, height 5\' 4"  (1.626 m), weight 60.6 kg, SpO2 99 %, unknown if currently breastfeeding. Exam Physical Exam  Prenatal labs: ABO, Rh: --/--/B POS (08/09 1914) Antibody: NEG (08/09 1914) Rubella:  imm RPR:   NR HBsAg:   Neg HIV:   NR GBS:    Results for orders placed or performed during the hospital encounter of 04/05/20 (from the past 24 hour(s))  CBC with Differential/Platelet     Status: Abnormal    Collection Time: 04/05/20  7:14 PM  Result Value Ref Range   WBC 8.8 4.0 - 10.5 K/uL   RBC 3.52 (L) 3.87 - 5.11 MIL/uL   Hemoglobin 10.8 (L) 12.0 - 15.0 g/dL   HCT 06/05/20 (L) 36 - 46 %   MCV 91.8 80.0 - 100.0 fL   MCH 30.7 26.0 - 34.0 pg   MCHC 33.4 30.0 - 36.0 g/dL   RDW 93.7 16.9 - 67.8 %   Platelets 179 150 - 400 K/uL   nRBC 0.0 0.0 - 0.2 %   Neutrophils Relative % 72 %   Neutro Abs 6.3 1.7 - 7.7 K/uL   Lymphocytes Relative 18 %   Lymphs Abs 1.6 0.7 - 4.0 K/uL   Monocytes Relative 8 %   Monocytes Absolute 0.7 0 - 1 K/uL   Eosinophils Relative 2 %   Eosinophils Absolute 0.2 0 - 0 K/uL   Basophils Relative 0 %   Basophils Absolute 0.0 0 - 0 K/uL   Immature Granulocytes 0 %   Abs Immature Granulocytes 0.03 0.00 - 0.07 K/uL  Comprehensive metabolic panel     Status: Abnormal   Collection Time: 04/05/20  7:14 PM  Result Value Ref Range   Sodium  132 (L) 135 - 145 mmol/L   Potassium 3.5 3.5 - 5.1 mmol/L   Chloride 102 98 - 111 mmol/L   CO2 22 22 - 32 mmol/L   Glucose, Bld 87 70 - 99 mg/dL   BUN 10 6 - 20 mg/dL   Creatinine, Ser 9.38 (H) 0.44 - 1.00 mg/dL   Calcium 8.3 (L) 8.9 - 10.3 mg/dL   Total Protein 5.9 (L) 6.5 - 8.1 g/dL   Albumin 2.9 (L) 3.5 - 5.0 g/dL   AST 14 (L) 15 - 41 U/L   ALT 11 0 - 44 U/L   Alkaline Phosphatase 39 38 - 126 U/L   Total Bilirubin 0.5 0.3 - 1.2 mg/dL   GFR calc non Af Amer >60 >60 mL/min   GFR calc Af Amer >60 >60 mL/min   Anion gap 8 5 - 15  Type and screen [facility]     Status: None   Collection Time: 04/05/20  7:14 PM  Result Value Ref Range   ABO/RH(D) B POS    Antibody Screen NEG    Sample Expiration      04/08/2020,2359 Performed at [facility], [address] [place], [place] [address]   Urinalysis, Routine w reflex microscopic Urine, Clean Catch     Status: Abnormal   Collection Time: 04/05/20  7:33 PM  Result Value Ref Range   Color, Urine YELLOW YELLOW   APPearance HAZY (A) CLEAR   Specific Gravity,  Urine 1.025 1.005 - 1.030   pH 6.0 5.0 - 8.0   Glucose, UA NEGATIVE NEGATIVE mg/dL   Hgb urine dipstick NEGATIVE NEGATIVE   Bilirubin Urine NEGATIVE NEGATIVE   Ketones, ur NEGATIVE NEGATIVE mg/dL   Protein, ur NEGATIVE NEGATIVE mg/dL   Nitrite NEGATIVE NEGATIVE   Leukocytes,Ua SMALL (A) NEGATIVE   RBC / HPF 0-5 0 - 5 RBC/hpf   WBC, UA 6-10 0 - 5 WBC/hpf   Bacteria, UA RARE (A) NONE SEEN   Squamous Epithelial / LPF 21-50 0 - 5   Mucus PRESENT   CT ABDOMEN PELVIS W CONTRAST CLINICAL DATA:  Right lower quadrant pain  EXAM: CT ABDOMEN AND PELVIS WITH CONTRAST  TECHNIQUE: Multidetector CT imaging of the abdomen and pelvis was performed using the standard protocol following bolus administration of intravenous contrast.  CONTRAST:  OMNIPAQUE IOHEXOL 300 MG/ML  SOLN  COMPARISON:  05/04/2016  FINDINGS: Lower chest: Minimal left base atelectasis.  No effusions.  Hepatobiliary: No focal hepatic abnormality. Gallbladder unremarkable.  Pancreas: No focal abnormality or ductal dilatation.  Spleen: No focal abnormality.  Normal size.  Adrenals/Urinary Tract: Punctate 2 mm right UVJ stone with severe right hydronephrosis. 2-3 mm nonobstructing stone in the midpole of the right kidney. No stones or hydronephrosis on the left. Delayed excretion of contrast from the right kidney due to obstruction. Urinary bladder and adrenal glands unremarkable.  Stomach/Bowel: Stomach, large and small bowel grossly unremarkable. Moderate stool in the colon.  Vascular/Lymphatic: No evidence of aneurysm or adenopathy.  Reproductive: Intrauterine gestation noted which appears of advanced gestational age. Anterior placenta. Cephalic presentation.  Other: No free fluid or free air.  Musculoskeletal: No acute bony abnormality.  IMPRESSION: 2 mm right UVJ stone with moderate to severe right hydronephrosis. Right nephrolithiasis.  Intrauterine gestation which appears to be of advanced  gestational age.  Moderate stool throughout the colon.  Electronically Signed   By: Charlett Nose M.D.   On: 04/05/2020 23:21    Assessment/Plan: 1) Admit to  ante 2) PCA for pain control 3) tamsulosin 0.4 mg daily 4) Consult urology in AM    [facility] 04/05/2020, 11:54 PM

## 2020-04-05 NOTE — MAU Provider Note (Signed)
History     CSN: [id number]  Arrival date and time: 04/05/20 6073   First Provider Initiated Contact with Patient 04/05/20 0345      Chief Complaint  Patient presents with  . Back Pain  . Vaginal Discharge  . Decreased Fetal Movement  . Abdominal Pain   HPI Jenny Barnes is a 24 y.o. G2P1001 at [redacted]w[redacted]d who presents with back pain and leaking of fluid. She states she spent all day on the boat yesterday and noticed she had back pain when she got off. She states around midnight, the pain became significantly worse and tylenol is not helping. She also reports leaking of fluid that she isn't sure if it is discharge or urine. She reports decreased fetal movement for the last 3 hours but is now feeling regular fetal movement in MAU.  OB History    Gravida  2   Para  1   Term  1   Preterm      AB      Living  1     SAB      TAB      Ectopic      Multiple  0   Live Births  1           Past Medical History:  Diagnosis Date  . Hypothyroidism   . Thyroid disease     Past Surgical History:  Procedure Laterality Date  . WISDOM TOOTH EXTRACTION      Family History  Problem Relation Age of Onset  . Kidney Stones Mother   . Kidney Stones Sister     Social History   Tobacco Use  . Smoking status: Never Smoker  . Smokeless tobacco: Never Used  Vaping Use  . Vaping Use: Never used  Substance Use Topics  . Alcohol use: No  . Drug use: Never    Allergies: No Known Allergies  Medications Prior to Admission  Medication Sig Dispense Refill Last Dose  . levothyroxine (SYNTHROID) 200 MCG tablet Take 200 mcg by mouth daily before breakfast.   04/04/2020 at Unknown time  . Prenatal Vit-Fe Fumarate-FA (PRENATAL MULTIVITAMIN) TABS tablet Take 1 tablet by mouth daily at 12 noon.   04/04/2020 at Unknown time  . famotidine (PEPCID) 20 MG tablet Take 40 mg by mouth at bedtime.     Marland Kitchen levothyroxine (SYNTHROID, LEVOTHROID) 150 MCG tablet Take 150 mcg by mouth daily  before breakfast. Combines with 25 mcg tablet for a total dose of 175 mcg.     . levothyroxine (SYNTHROID, LEVOTHROID) 25 MCG tablet Take 25 mcg by mouth daily before breakfast. Combines with 150 mcg tablet for a total dose of 175 mcg.       Review of Systems  Constitutional: Negative.  Negative for fatigue and fever.  HENT: Negative.   Respiratory: Negative.  Negative for shortness of breath.   Cardiovascular: Negative.  Negative for chest pain.  Gastrointestinal: Negative.  Negative for abdominal pain, constipation, diarrhea, nausea and vomiting.  Genitourinary: Positive for vaginal discharge. Negative for dysuria and vaginal bleeding.  Musculoskeletal: Positive for back pain.  Neurological: Negative.  Negative for dizziness and headaches.   Physical Exam   unknown if currently breastfeeding.  Physical Exam Vitals and nursing note reviewed.  Constitutional:      General: She is not in acute distress.    Appearance: She is well-developed.  HENT:     Head: Normocephalic.  Eyes:     Pupils: Pupils are equal, round, and reactive  to light.  Cardiovascular:     Rate and Rhythm: Normal rate and regular rhythm.     Heart sounds: Normal heart sounds.  Pulmonary:     Effort: Pulmonary effort is normal. No respiratory distress.     Breath sounds: Normal breath sounds.  Abdominal:     General: Bowel sounds are normal. There is no distension.     Palpations: Abdomen is soft.     Tenderness: There is no abdominal tenderness. There is right CVA tenderness.  Genitourinary:    Comments: SSE: Cervix pink, visually closed, without lesion, scant white creamy discharge, vaginal walls and external genitalia normal, NO pooling Skin:    General: Skin is warm and dry.  Neurological:     Mental Status: She is alert and oriented to person, place, and time.  Psychiatric:        Behavior: Behavior normal.        Thought Content: Thought content normal.        Judgment: Judgment normal.     Dilation: Closed Effacement (%): Thick Cervical Position: Posterior Station: Ballotable Exam by:: Ma Hillock CNM  Fetal Tracing:  Baseline: 130 Variability: moderate Accels: 10x10 Decels: none  Toco: none  MAU Course  Procedures Results for orders placed or performed during the hospital encounter of 04/05/20 (from the past 24 hour(s))  Urinalysis, Routine w reflex microscopic Urine, Clean Catch     Status: Abnormal   Collection Time: 04/05/20  3:32 AM  Result Value Ref Range   Color, Urine STRAW (A) YELLOW   APPearance CLEAR CLEAR   Specific Gravity, Urine 1.006 1.005 - 1.030   pH 7.0 5.0 - 8.0   Glucose, UA NEGATIVE NEGATIVE mg/dL   Hgb urine dipstick NEGATIVE NEGATIVE   Bilirubin Urine NEGATIVE NEGATIVE   Ketones, ur NEGATIVE NEGATIVE mg/dL   Protein, ur NEGATIVE NEGATIVE mg/dL   Nitrite NEGATIVE NEGATIVE   Leukocytes,Ua NEGATIVE NEGATIVE  Wet prep, genital     Status: Abnormal   Collection Time: 04/05/20  3:58 AM  Result Value Ref Range   Yeast Wet Prep HPF POC NONE SEEN NONE SEEN   Trich, Wet Prep NONE SEEN NONE SEEN   Clue Cells Wet Prep HPF POC NONE SEEN NONE SEEN   WBC, Wet Prep HPF POC FEW (A) NONE SEEN   Sperm NONE SEEN    MDM UA Wet prep and gc/chlamydia Flexeril PO- no improvement of pain  No evidence of rupture of membranes on exam. Fern negative VSS, afebrile, no urinary complaints and normal UA- no concern for kidney stones of pyelonephritis. NST reassuring, normal fetal movement while in MAU  Likely musculoskeletal pain from being on the boat- will try percocet PO Patient reports improvement in back pain.   Assessment and Plan   1. Back pain affecting pregnancy in third trimester   2. Musculoskeletal pain   3. [redacted] weeks gestation of pregnancy   4. NST (non-stress test) reactive   5. Encounter for suspected premature rupture of amniotic membranes, with rupture of membranes not found    -Discharge home in stable condition -Rx for flexeril  sent to patient's pharmacy -Preterm labor precautions discussed -Patient advised to follow-up with OB as scheduled for prenatal care -Patient may return to MAU as needed or if her condition were to change or worsen   Rolm Bookbinder CNM 04/05/2020, 3:45 AM

## 2020-04-06 LAB — COMPREHENSIVE METABOLIC PANEL
ALT: 9 U/L (ref 0–44)
AST: 13 U/L — ABNORMAL LOW (ref 15–41)
Albumin: 2.5 g/dL — ABNORMAL LOW (ref 3.5–5.0)
Alkaline Phosphatase: 39 U/L (ref 38–126)
Anion gap: 6 (ref 5–15)
BUN: 9 mg/dL (ref 6–20)
CO2: 23 mmol/L (ref 22–32)
Calcium: 8.1 mg/dL — ABNORMAL LOW (ref 8.9–10.3)
Chloride: 102 mmol/L (ref 98–111)
Creatinine, Ser: 1.09 mg/dL — ABNORMAL HIGH (ref 0.44–1.00)
GFR calc Af Amer: >60 mL/min (ref >60)
GFR calc non Af Amer: >60 mL/min (ref >60)
Glucose, Bld: 82 mg/dL (ref 70–99)
Potassium: 3.5 mmol/L (ref 3.5–5.1)
Sodium: 131 mmol/L — ABNORMAL LOW (ref 135–145)
Total Bilirubin: 0.6 mg/dL (ref 0.3–1.2)
Total Protein: 5.4 g/dL — ABNORMAL LOW (ref 6.5–8.1)

## 2020-04-06 LAB — CBC
HCT: 29.4 % — ABNORMAL LOW (ref 36.0–46.0)
Hemoglobin: 9.7 g/dL — ABNORMAL LOW (ref 12.0–15.0)
MCH: 30.3 pg (ref 26.0–34.0)
MCHC: 33 g/dL (ref 30.0–36.0)
MCV: 91.9 fL (ref 80.0–100.0)
Platelets: 142 10*3/uL — ABNORMAL LOW (ref 150–400)
RBC: 3.2 MIL/uL — ABNORMAL LOW (ref 3.87–5.11)
RDW: 12.6 % (ref 11.5–15.5)
WBC: 8.7 10*3/uL (ref 4.0–10.5)
nRBC: 0 % (ref 0.0–0.2)

## 2020-04-06 LAB — SARS CORONAVIRUS 2 BY RT PCR (HOSPITAL ORDER, PERFORMED IN ~~LOC~~ HOSPITAL LAB): SARS Coronavirus 2: NEGATIVE

## 2020-04-06 LAB — CULTURE, OB URINE: Culture: >=100000 — AB

## 2020-04-06 MED ORDER — DIPHENHYDRAMINE HCL 50 MG/ML IJ SOLN: 12.5000 mg | Freq: Four times a day (QID) | INTRAMUSCULAR | Status: DC | PRN

## 2020-04-06 MED ORDER — NALOXONE HCL 0.4 MG/ML IJ SOLN: 0.4000 mg | INTRAMUSCULAR | Status: DC | PRN

## 2020-04-06 MED ORDER — SODIUM CHLORIDE 0.9 % IV SOLN: 50.0000 mg | Freq: Once | INTRAVENOUS | Status: DC

## 2020-04-06 MED ORDER — DIPHENHYDRAMINE HCL 12.5 MG/5ML PO ELIX: 12.5000 mg | ORAL_SOLUTION | Freq: Four times a day (QID) | ORAL | Status: DC | PRN

## 2020-04-06 MED ORDER — SODIUM CHLORIDE 0.9% FLUSH: 9.0000 mL | INTRAVENOUS | Status: DC | PRN

## 2020-04-06 MED ORDER — LEVOTHYROXINE SODIUM 200 MCG PO TABS
200.0000 ug | ORAL_TABLET | Freq: Every day | ORAL | Status: DC
  Administered 2020-04-06 – 2020-04-14 (×8): 200 ug via ORAL
  Filled 2020-04-06 (×2): qty 2
  Filled 2020-04-06: qty 8
  Filled 2020-04-06 (×2): qty 2
  Filled 2020-04-06 (×2): qty 1
  Filled 2020-04-06: qty 2
  Filled 2020-04-06 (×2): qty 8
  Filled 2020-04-06: qty 2

## 2020-04-06 MED ORDER — ONDANSETRON HCL 4 MG/2ML IJ SOLN
4.0000 mg | Freq: Four times a day (QID) | INTRAMUSCULAR | Status: DC | PRN
  Administered 2020-04-06 – 2020-04-07 (×4): 4 mg via INTRAVENOUS
  Filled 2020-04-06 (×4): qty 2

## 2020-04-06 MED ORDER — SODIUM CHLORIDE 0.9 % IV SOLN
500.0000 mg | Freq: Once | INTRAVENOUS | Status: AC
  Administered 2020-04-06: 500 mg via INTRAVENOUS
  Filled 2020-04-06: qty 25

## 2020-04-06 MED ORDER — HYDROMORPHONE 1 MG/ML IV SOLN
INTRAVENOUS | Status: DC
  Administered 2020-04-06: 0.3 mg via INTRAVENOUS
  Administered 2020-04-06: 1.5 mg via INTRAVENOUS
  Administered 2020-04-06: 0.6 mg via INTRAVENOUS
  Administered 2020-04-06: 30 mg via INTRAVENOUS
  Administered 2020-04-06: 0.6 mg via INTRAVENOUS
  Administered 2020-04-06: 1.1 mg via INTRAVENOUS
  Administered 2020-04-07: 0.6 mg via INTRAVENOUS
  Administered 2020-04-07 (×4): 1.2 mg via INTRAVENOUS
  Filled 2020-04-06: qty 30

## 2020-04-06 MED ORDER — CEPHALEXIN 500 MG PO CAPS
500.0000 mg | ORAL_CAPSULE | Freq: Two times a day (BID) | ORAL | Status: DC
  Administered 2020-04-06 – 2020-04-12 (×11): 500 mg via ORAL
  Filled 2020-04-06 (×12): qty 1

## 2020-04-06 NOTE — Progress Notes (Signed)
24 y.o. G2P1001 [redacted]w[redacted]d HD#1 admitted for Nephrolithiasis [N20.0].  Pt currently stable with no c/o.  Good FM.  Vitals:   04/06/20 0200 04/06/20 0300 04/06/20 0400 04/06/20 0543  BP: 113/70 108/67 (!) 104/53   Pulse: 75 76 67   Resp:   15 13  Temp:   97.9 F (36.6 C)   TempSrc:   Oral   SpO2:   99% 98%  Weight:      Height:        Lungs CTA Cor RRR Abd  Soft, gravid, nontender Ex SCDs FHTs  120s, good short term variability, NST R Toco  occ  Results for orders placed or performed during the hospital encounter of 04/05/20 (from the past 24 hour(s))  CBC with Differential/Platelet     Status: Abnormal   Collection Time: 04/05/20  7:14 PM  Result Value Ref Range   WBC 8.8 4.0 - 10.5 K/uL   RBC 3.52 (L) 3.87 - 5.11 MIL/uL   Hemoglobin 10.8 (L) 12.0 - 15.0 g/dL   HCT 56.3 (L) 36 - 46 %   MCV 91.8 80.0 - 100.0 fL   MCH 30.7 26.0 - 34.0 pg   MCHC 33.4 30.0 - 36.0 g/dL   RDW 14.9 70.2 - 63.7 %   Platelets 179 150 - 400 K/uL   nRBC 0.0 0.0 - 0.2 %   Neutrophils Relative % 72 %   Neutro Abs 6.3 1.7 - 7.7 K/uL   Lymphocytes Relative 18 %   Lymphs Abs 1.6 0.7 - 4.0 K/uL   Monocytes Relative 8 %   Monocytes Absolute 0.7 0 - 1 K/uL   Eosinophils Relative 2 %   Eosinophils Absolute 0.2 0 - 0 K/uL   Basophils Relative 0 %   Basophils Absolute 0.0 0 - 0 K/uL   Immature Granulocytes 0 %   Abs Immature Granulocytes 0.03 0.00 - 0.07 K/uL  Comprehensive metabolic panel     Status: Abnormal   Collection Time: 04/05/20  7:14 PM  Result Value Ref Range   Sodium 132 (L) 135 - 145 mmol/L   Potassium 3.5 3.5 - 5.1 mmol/L   Chloride 102 98 - 111 mmol/L   CO2 22 22 - 32 mmol/L   Glucose, Bld 87 70 - 99 mg/dL   BUN 10 6 - 20 mg/dL   Creatinine, Ser 8.58 (H) 0.44 - 1.00 mg/dL   Calcium 8.3 (L) 8.9 - 10.3 mg/dL   Total Protein 5.9 (L) 6.5 - 8.1 g/dL   Albumin 2.9 (L) 3.5 - 5.0 g/dL   AST 14 (L) 15 - 41 U/L   ALT 11 0 - 44 U/L   Alkaline Phosphatase 39 38 - 126 U/L   Total Bilirubin  0.5 0.3 - 1.2 mg/dL   GFR calc non Af Amer >60 >60 mL/min   GFR calc Af Amer >60 >60 mL/min   Anion gap 8 5 - 15  Type and screen [facility]     Status: None   Collection Time: 04/05/20  7:14 PM  Result Value Ref Range   ABO/RH(D) B POS    Antibody Screen NEG    Sample Expiration      04/08/2020,2359 Performed at [facility], [address] [place], [place] [address]   Urinalysis, Routine w reflex microscopic Urine, Clean Catch     Status: Abnormal   Collection Time: 04/05/20  7:33 PM  Result Value Ref Range   Color, Urine YELLOW YELLOW   APPearance HAZY (  A) CLEAR   Specific Gravity, Urine 1.025 1.005 - 1.030   pH 6.0 5.0 - 8.0   Glucose, UA NEGATIVE NEGATIVE mg/dL   Hgb urine dipstick NEGATIVE NEGATIVE   Bilirubin Urine NEGATIVE NEGATIVE   Ketones, ur NEGATIVE NEGATIVE mg/dL   Protein, ur NEGATIVE NEGATIVE mg/dL   Nitrite NEGATIVE NEGATIVE   Leukocytes,Ua SMALL (A) NEGATIVE   RBC / HPF 0-5 0 - 5 RBC/hpf   WBC, UA 6-10 0 - 5 WBC/hpf   Bacteria, UA RARE (A) NONE SEEN   Squamous Epithelial / LPF 21-50 0 - 5   Mucus PRESENT   SARS Coronavirus 2 by RT PCR (hospital order, performed in [facility] hospital lab) Nasopharyngeal Nasopharyngeal Swab     Status: None   Collection Time: 04/05/20 11:53 PM   Specimen: Nasopharyngeal Swab  Result Value Ref Range   SARS Coronavirus 2 NEGATIVE NEGATIVE  Comprehensive metabolic panel     Status: Abnormal   Collection Time: 04/06/20  5:57 AM  Result Value Ref Range   Sodium 131 (L) 135 - 145 mmol/L   Potassium 3.5 3.5 - 5.1 mmol/L   Chloride 102 98 - 111 mmol/L   CO2 23 22 - 32 mmol/L   Glucose, Bld 82 70 - 99 mg/dL   BUN 9 6 - 20 mg/dL   Creatinine, Ser 9.14 (H) 0.44 - 1.00 mg/dL   Calcium 8.1 (L) 8.9 - 10.3 mg/dL   Total Protein 5.4 (L) 6.5 - 8.1 g/dL   Albumin 2.5 (L) 3.5 - 5.0 g/dL   AST 13 (L) 15 - 41 U/L   ALT 9 0 - 44 U/L   Alkaline Phosphatase 39 38 - 126 U/L   Total Bilirubin 0.6 0.3 - 1.2 mg/dL    GFR calc non Af Amer >60 >60 mL/min   GFR calc Af Amer >60 >60 mL/min   Anion gap 6 5 - 15  CBC     Status: Abnormal   Collection Time: 04/06/20  5:57 AM  Result Value Ref Range   WBC 8.7 4.0 - 10.5 K/uL   RBC 3.20 (L) 3.87 - 5.11 MIL/uL   Hemoglobin 9.7 (L) 12.0 - 15.0 g/dL   HCT 78.2 (L) 36 - 46 %   MCV 91.9 80.0 - 100.0 fL   MCH 30.3 26.0 - 34.0 pg   MCHC 33.0 30.0 - 36.0 g/dL   RDW 95.6 21.3 - 08.6 %   Platelets 142 (L) 150 - 400 K/uL   nRBC 0.0 0.0 - 0.2 %    A:  HD#1  [redacted]w[redacted]d with kidney stone.  P: Kidney stone, Cr 1.09: 1. Continue flomax and hydration.   2. Urology consult in case needs stent or stone removal.   Anemia: 1. Iron infusion today.  Possible UTI as well: 1. Urine culture pending.   Loney Laurence

## 2020-04-06 NOTE — Consult Note (Signed)
Reason for Consult: Rt Ureteral / Renal Stones with Hydronephrosis  Referring Physician: Carrington Clamp MD  Jenny Barnes is an 24 y.o. female.   HPI:   1 - Rt Ureteral / Renal Stones with Hydronephrosis - 29mm Rt UVJ (intramural ureter) and 86mm Rt mid papillary tip stones on CT 03/2020 on eval flank pain. Cr 1.09. UA without infectious parameters. In early 3rd trimester pregnancy. STarted on medical therapy with tamsulosin.  Her GYN is Dr. Henderson Cloud. She is G2P1 with 56mo old.  Today " Jenny Barnes " is seen in consultation for above.   Past Medical History:  Diagnosis Date  . Hypothyroidism   . Thyroid disease     Past Surgical History:  Procedure Laterality Date  . WISDOM TOOTH EXTRACTION      Family History  Problem Relation Age of Onset  . Kidney Stones Mother   . Kidney Stones Sister     Social History:  reports that she has never smoked. She has never used smokeless tobacco. She reports that she does not drink alcohol and does not use drugs.  Allergies: No Known Allergies  Medications: I have reviewed the patient's current medications.  Results for orders placed or performed during the hospital encounter of 04/05/20 (from the past 48 hour(s))  CBC with Differential/Platelet     Status: Abnormal   Collection Time: 04/05/20  7:14 PM  Result Value Ref Range   WBC 8.8 4.0 - 10.5 K/uL   RBC 3.52 (L) 3.87 - 5.11 MIL/uL   Hemoglobin 10.8 (L) 12.0 - 15.0 g/dL   HCT 76.2 (L) 36 - 46 %   MCV 91.8 80.0 - 100.0 fL   MCH 30.7 26.0 - 34.0 pg   MCHC 33.4 30.0 - 36.0 g/dL   RDW 83.1 51.7 - 61.6 %   Platelets 179 150 - 400 K/uL   nRBC 0.0 0.0 - 0.2 %   Neutrophils Relative % 72 %   Neutro Abs 6.3 1.7 - 7.7 K/uL   Lymphocytes Relative 18 %   Lymphs Abs 1.6 0.7 - 4.0 K/uL   Monocytes Relative 8 %   Monocytes Absolute 0.7 0 - 1 K/uL   Eosinophils Relative 2 %   Eosinophils Absolute 0.2 0 - 0 K/uL   Basophils Relative 0 %   Basophils Absolute 0.0 0 - 0 K/uL   Immature  Granulocytes 0 %   Abs Immature Granulocytes 0.03 0.00 - 0.07 K/uL    Comment: Performed at [facility], [address], [place], [place] [address]  Comprehensive metabolic panel     Status: Abnormal   Collection Time: 04/05/20  7:14 PM  Result Value Ref Range   Sodium 132 (L) 135 - 145 mmol/L   Potassium 3.5 3.5 - 5.1 mmol/L   Chloride 102 98 - 111 mmol/L   CO2 22 22 - 32 mmol/L   Glucose, Bld 87 70 - 99 mg/dL    Comment: Glucose reference range applies only to samples taken after fasting for at least 8 hours.   BUN 10 6 - 20 mg/dL   Creatinine, Ser 0.62 (H) 0.44 - 1.00 mg/dL   Calcium 8.3 (L) 8.9 - 10.3 mg/dL   Total Protein 5.9 (L) 6.5 - 8.1 g/dL   Albumin 2.9 (L) 3.5 - 5.0 g/dL   AST 14 (L) 15 - 41 U/L   ALT 11 0 - 44 U/L   Alkaline Phosphatase 39 38 - 126 U/L   Total Bilirubin 0.5 0.3 - 1.2 mg/dL  GFR calc non Af Amer >60 >60 mL/min   GFR calc Af Amer >60 >60 mL/min   Anion gap 8 5 - 15    Comment: Performed at [facility], [address] [place], [place] [address]  Type and screen [facility]     Status: None   Collection Time: 04/05/20  7:14 PM  Result Value Ref Range   ABO/RH(D) B POS    Antibody Screen NEG    Sample Expiration      04/08/2020,2359 Performed at [facility], [address] [place], [place] [address]   Urinalysis, Routine w reflex microscopic Urine, Clean Catch     Status: Abnormal   Collection Time: 04/05/20  7:33 PM  Result Value Ref Range   Color, Urine YELLOW YELLOW   APPearance HAZY (A) CLEAR   Specific Gravity, Urine 1.025 1.005 - 1.030   pH 6.0 5.0 - 8.0   Glucose, UA NEGATIVE NEGATIVE mg/dL   Hgb urine dipstick NEGATIVE NEGATIVE   Bilirubin Urine NEGATIVE NEGATIVE   Ketones, ur NEGATIVE NEGATIVE mg/dL   Protein, ur NEGATIVE NEGATIVE mg/dL   Nitrite NEGATIVE NEGATIVE   Leukocytes,Ua SMALL (A) NEGATIVE   RBC / HPF 0-5 0 - 5 RBC/hpf   WBC, UA 6-10 0 - 5 WBC/hpf   Bacteria, UA RARE (A) NONE SEEN    Squamous Epithelial / LPF 21-50 0 - 5   Mucus PRESENT     Comment: Performed at [facility], [address] [place], [place] [address]  SARS Coronavirus 2 by RT PCR (hospital order, performed in [facility] hospital lab) Nasopharyngeal Nasopharyngeal Swab     Status: None   Collection Time: 04/05/20 11:53 PM   Specimen: Nasopharyngeal Swab  Result Value Ref Range   SARS Coronavirus 2 NEGATIVE NEGATIVE    Comment: (NOTE) SARS-CoV-2 target nucleic acids are NOT DETECTED.  The SARS-CoV-2 RNA is generally detectable in upper and lower respiratory specimens during the acute phase of infection. The lowest concentration of SARS-CoV-2 viral copies this assay can detect is 250 copies / mL. A negative result does not preclude SARS-CoV-2 infection and should not be used as the sole basis for treatment or other patient management decisions.  A negative result may occur with improper specimen collection / handling, submission of specimen other than nasopharyngeal swab, presence of viral mutation(s) within the areas targeted by this assay, and inadequate number of viral copies (<250 copies / mL). A negative result must be combined with clinical observations, patient history, and epidemiological information.  Fact Sheet for Patients:   BoilerBrush.com.cyhttps://www.fda.gov/media/136312/download  Fact Sheet for Healthcare Providers: https://pope.com/https://www.fda.gov/media/136313/download  This test is not yet approved or  cleared by the [place] FDA and has been authorized for detection and/or diagnosis of SARS-CoV-2 by FDA under an Emergency Use Authorization (EUA).  This EUA will remain in effect (meaning this test can be used) for the duration of the COVID-19 declaration under Section 564(b)(1) of the Act, 21 U.S.C. section 360bbb-3(b)(1), unless the authorization is terminated or revoked sooner.  Performed at [facility], [address] [place], [place] [address]   Comprehensive metabolic panel      Status: Abnormal   Collection Time: 04/06/20  5:57 AM  Result Value Ref Range   Sodium 131 (L) 135 - 145 mmol/L   Potassium 3.5 3.5 - 5.1 mmol/L   Chloride 102 98 - 111 mmol/L   CO2 23 22 - 32 mmol/L   Glucose,  Bld 82 70 - 99 mg/dL    Comment: Glucose reference range applies only to samples taken after fasting for at least 8 hours.   BUN 9 6 - 20 mg/dL   Creatinine, Ser 0.86 (H) 0.44 - 1.00 mg/dL   Calcium 8.1 (L) 8.9 - 10.3 mg/dL   Total Protein 5.4 (L) 6.5 - 8.1 g/dL   Albumin 2.5 (L) 3.5 - 5.0 g/dL   AST 13 (L) 15 - 41 U/L   ALT 9 0 - 44 U/L   Alkaline Phosphatase 39 38 - 126 U/L   Total Bilirubin 0.6 0.3 - 1.2 mg/dL   GFR calc non Af Amer >60 >60 mL/min   GFR calc Af Amer >60 >60 mL/min   Anion gap 6 5 - 15    Comment: Performed at [facility], [address], [place], [place] [address]  CBC     Status: Abnormal   Collection Time: 04/06/20  5:57 AM  Result Value Ref Range   WBC 8.7 4.0 - 10.5 K/uL   RBC 3.20 (L) 3.87 - 5.11 MIL/uL   Hemoglobin 9.7 (L) 12.0 - 15.0 g/dL   HCT 96.2 (L) 36 - 46 %   MCV 91.9 80.0 - 100.0 fL   MCH 30.3 26.0 - 34.0 pg   MCHC 33.0 30.0 - 36.0 g/dL   RDW 95.2 84.1 - 32.4 %   Platelets 142 (L) 150 - 400 K/uL   nRBC 0.0 0.0 - 0.2 %    Comment: Performed at [facility], [address] [place], [place] [address]    CT ABDOMEN PELVIS W CONTRAST  Result Date: 04/05/2020 CLINICAL DATA:  Right lower quadrant pain EXAM: CT ABDOMEN AND PELVIS WITH CONTRAST TECHNIQUE: Multidetector CT imaging of the abdomen and pelvis was performed using the standard protocol following bolus administration of intravenous contrast. CONTRAST:  OMNIPAQUE IOHEXOL 300 MG/ML  SOLN COMPARISON:  05/04/2016 FINDINGS: Lower chest: Minimal left base atelectasis.  No effusions. Hepatobiliary: No focal hepatic abnormality. Gallbladder unremarkable. Pancreas: No focal abnormality or ductal dilatation. Spleen: No focal abnormality.  Normal size. Adrenals/Urinary Tract:  Punctate 2 mm right UVJ stone with severe right hydronephrosis. 2-3 mm nonobstructing stone in the midpole of the right kidney. No stones or hydronephrosis on the left. Delayed excretion of contrast from the right kidney due to obstruction. Urinary bladder and adrenal glands unremarkable. Stomach/Bowel: Stomach, large and small bowel grossly unremarkable. Moderate stool in the colon. Vascular/Lymphatic: No evidence of aneurysm or adenopathy. Reproductive: Intrauterine gestation noted which appears of advanced gestational age. Anterior placenta. Cephalic presentation. Other: No free fluid or free air. Musculoskeletal: No acute bony abnormality. IMPRESSION: 2 mm right UVJ stone with moderate to severe right hydronephrosis. Right nephrolithiasis. Intrauterine gestation which appears to be of advanced gestational age. Moderate stool throughout the colon. Electronically Signed   By: Charlett Nose M.D.   On: 04/05/2020 23:21    Review of Systems  Constitutional: Negative for chills and fever.  Genitourinary: Positive for flank pain.  All other systems reviewed and are negative.  Blood pressure 102/60, pulse 64, temperature 98 F (36.7 C), temperature source Oral, resp. rate 16, height 5\' 4"  (1.626 m), weight 60.6 kg, SpO2 98 %, unknown if currently breastfeeding. Physical Exam Vitals reviewed.  Constitutional:      Comments: Very pleasant. Mother and husband at bedside.   HENT:     Head: Normocephalic.     Nose: Nose normal.  Eyes:     Pupils:  Pupils are equal, round, and reactive to light.  Cardiovascular:     Rate and Rhythm: Normal rate.     Pulses: Normal pulses.  Pulmonary:     Effort: Pulmonary effort is normal.  Abdominal:     General: Abdomen is flat.  Genitourinary:    Comments: Mild Rt CVAT at present. Gravid Uterus.  Musculoskeletal:        General: Normal range of motion.     Cervical back: Normal range of motion.  Skin:    General: Skin is warm.  Neurological:     General: No  focal deficit present.     Mental Status: She is alert.  Psychiatric:        Mood and Affect: Mood normal.     Assessment/Plan:  1 - Rt Ureteral / Renal Stones with Hydronephrosis - discussed current obstructing stone small land distal with about 90% chance of medical passage as long as colic is manageable. Other options would be right ureteroscopy with goal of stone free, and I would favor this if no interval passage in few days.  Rec Keflex for now in preventative setting to prevent progression to obstructing pyelo.   Pt, family, and NSG voiced understanding of importance to strain all urine.   Please call me directly with questions anytime.   Sebastian Ache 04/06/2020, 9:56 AM

## 2020-04-07 NOTE — Progress Notes (Signed)
HD#2 29.5 with R sided hydronephrosis with 48mm stone at UVJ.  Pt is comfortable and eating, denies contractions, reports good FM.   Last NST in PM : Cat 1 TOCO: quite SVE deferred A/P: 29.5 with nephrolithiasis S/p urology consult- we will follow recommendation of Flomax, Keflex and PCA pain control. Per pt, plan is if no passage of stone by Thursday, urology will schedule her for stent on Friday. SCDs in place NST q shift Other current mgmt

## 2020-04-07 NOTE — Progress Notes (Signed)
Subjective/Chief Complaint:   1 - Rt Ureteral / Renal Stones with Hydronephrosis - 25mm Rt UVJ (intramural ureter) and 57mm Rt mid papillary tip stones on CT 03/2020 on eval flank pain. Cr 1.09. UA without infectious parameters. In early 3rd trimester pregnancy. STarted on medical therapy with tamsulosin.  Today " Jenny Barnes " is stable. Still with difficult colic, more nausea now, but did sleep decent last PM. No contractions.   Objective: Vital signs in last 24 hours: Temp:  [97.8 F (36.6 C)-98.8 F (37.1 C)] 98.7 F (37.1 C) (08/11 1611) Pulse Rate:  [82-87] 83 (08/11 1611) Resp:  [16-21] 17 (08/11 1812) BP: (98-102)/(51-85) 98/60 (08/11 1611) SpO2:  [95 %-100 %] 97 % (08/11 1812) Last BM Date: 04/05/20  Intake/Output from previous day: 08/10 0701 - 08/11 0700 In: 3870.4 [P.O.:1680; I.V.:1915.2; IV Piggyback:275.1] Out: 2650 [Urine:2650] Intake/Output this shift: No intake/output data recorded.  Physical Exam Vitals reviewed.  Constitutional:      Comments: Very pleasant. In good spirits HENT:     Head: Normocephalic.     Nose: Nose normal.  Eyes:     Pupils: Pupils are equal, round, and reactive to light.  Cardiovascular:     Rate and Rhythm: Normal rate.     Pulses: Normal pulses.  Pulmonary:     Effort: Pulmonary effort is normal.  Abdominal:     General: Abdomen is flat.  Genitourinary:    Comments: Mild Rt CVAT at present. Gravid Uterus.  Musculoskeletal:        General: Normal range of motion.     Cervical back: Normal range of motion.  Skin:    General: Skin is warm.  Neurological:     General: No focal deficit present.     Mental Status: She is alert.  Psychiatric:        Mood and Affect: Mood normal.    Lab Results:  Recent Labs    04/05/20 1914 04/06/20 0557  WBC 8.8 8.7  HGB 10.8* 9.7*  HCT 32.3* 29.4*  PLT 179 142*   BMET Recent Labs    04/05/20 1914 04/06/20 0557  NA 132* 131*  K 3.5 3.5  CL 102 102  CO2 22 23  GLUCOSE 87 82   BUN 10 9  CREATININE 1.05* 1.09*  CALCIUM 8.3* 8.1*   PT/INR No results for input(s): LABPROT, INR in the last 72 hours. ABG No results for input(s): PHART, HCO3 in the last 72 hours.  Invalid input(s): PCO2, PO2  Studies/Results: CT ABDOMEN PELVIS W CONTRAST  Result Date: 04/05/2020 CLINICAL DATA:  Right lower quadrant pain EXAM: CT ABDOMEN AND PELVIS WITH CONTRAST TECHNIQUE: Multidetector CT imaging of the abdomen and pelvis was performed using the standard protocol following bolus administration of intravenous contrast. CONTRAST:  OMNIPAQUE IOHEXOL 300 MG/ML  SOLN COMPARISON:  05/04/2016 FINDINGS: Lower chest: Minimal left base atelectasis.  No effusions. Hepatobiliary: No focal hepatic abnormality. Gallbladder unremarkable. Pancreas: No focal abnormality or ductal dilatation. Spleen: No focal abnormality.  Normal size. Adrenals/Urinary Tract: Punctate 2 mm right UVJ stone with severe right hydronephrosis. 2-3 mm nonobstructing stone in the midpole of the right kidney. No stones or hydronephrosis on the left. Delayed excretion of contrast from the right kidney due to obstruction. Urinary bladder and adrenal glands unremarkable. Stomach/Bowel: Stomach, large and small bowel grossly unremarkable. Moderate stool in the colon. Vascular/Lymphatic: No evidence of aneurysm or adenopathy. Reproductive: Intrauterine gestation noted which appears of advanced gestational age. Anterior placenta. Cephalic presentation. Other: No free  fluid or free air. Musculoskeletal: No acute bony abnormality. IMPRESSION: 2 mm right UVJ stone with moderate to severe right hydronephrosis. Right nephrolithiasis. Intrauterine gestation which appears to be of advanced gestational age. Moderate stool throughout the colon. Electronically Signed   By: Charlett Nose M.D.   On: 04/05/2020 23:21    Anti-infectives: Anti-infectives (From admission, onward)   Start     Dose/Rate Route Frequency Ordered Stop   04/06/20 2200   cephALEXin (KEFLEX) capsule 500 mg     Discontinue     500 mg Oral Every 12 hours 04/06/20 2005        Assessment/Plan:  1 - Rt Ureteral / Renal Stones with Hydronephrosis - Pt with good understanding of competing risks of surgery v. Stone and elects current plan of medical therapy for now and ureteroscopy this Friday with goal of stone free if does not pass prior. We have began plannign staffing / equipment logistics for this.   Continue proph keflex.  Appreciate OB service comanagment.   Please call me directly with questions anytime.  Sebastian Ache 04/07/2020

## 2020-04-07 NOTE — Plan of Care (Signed)
  Problem: Education: Goal: Knowledge of disease or condition will improve Outcome: Completed/Met Goal: Knowledge of the prescribed therapeutic regimen will improve Outcome: Completed/Met   Problem: Education: Goal: Knowledge of General Education information will improve Description: Including pain rating scale, medication(s)/side effects and non-pharmacologic comfort measures Outcome: Completed/Met   Problem: Nutrition: Goal: Adequate nutrition will be maintained Outcome: Completed/Met   Problem: Coping: Goal: Level of anxiety will decrease Outcome: Completed/Met

## 2020-04-07 NOTE — Lactation Note (Signed)
Lactation Consultation Note  Patient Name: Jenny Barnes Today's Date: 04/07/2020  Mom is a g2P1.  Mom reports she tried to breastfeed with her first baby but didn't make it very far. Mom reports she had to supplement with formula that her milk came in a few days later than expected and then she was horribly engorged and couldn't get it to flow once it did come. Reviewed possibility of having preterm infant.  Discussed with mom importance  Of pumping within 6 hours after discharge and 8-12 times day for 15 minutes. Discussed adding massage and hand expression to pumping.  Gave mom [facility] handouts.  Urged to call  As needed.    Maternal Data    Feeding    LATCH Score                   Interventions    Lactation Tools Discussed/Used     Consult Status      Jenny Barnes 04/07/2020, 2:37 PM

## 2020-04-08 ENCOUNTER — Other Ambulatory Visit: Payer: Self-pay | Admitting: Urology

## 2020-04-08 MED ORDER — PROMETHAZINE HCL 25 MG/ML IJ SOLN
12.5000 mg | Freq: Four times a day (QID) | INTRAMUSCULAR | Status: DC | PRN
  Administered 2020-04-08 (×2): 25 mg via INTRAVENOUS
  Administered 2020-04-09: 12.5 mg via INTRAVENOUS
  Administered 2020-04-09 – 2020-04-12 (×6): 25 mg via INTRAVENOUS
  Administered 2020-04-13: 12.5 mg via INTRAVENOUS
  Filled 2020-04-08 (×11): qty 1

## 2020-04-08 MED ORDER — SODIUM CHLORIDE 0.9% FLUSH: 9.0000 mL | INTRAVENOUS | Status: DC | PRN

## 2020-04-08 MED ORDER — ONDANSETRON 4 MG PO TBDP
4.0000 mg | ORAL_TABLET | Freq: Three times a day (TID) | ORAL | Status: DC | PRN
  Administered 2020-04-08 – 2020-04-12 (×3): 4 mg via ORAL
  Filled 2020-04-08 (×3): qty 1

## 2020-04-08 MED ORDER — HYDROMORPHONE 1 MG/ML IV SOLN
INTRAVENOUS | Status: DC
  Administered 2020-04-08: 30 mg via INTRAVENOUS
  Administered 2020-04-09: 0.9 mg via INTRAVENOUS
  Filled 2020-04-08: qty 30

## 2020-04-08 MED ORDER — DIPHENHYDRAMINE HCL 50 MG/ML IJ SOLN: 12.5000 mg | Freq: Four times a day (QID) | INTRAMUSCULAR | Status: DC | PRN

## 2020-04-08 MED ORDER — OXYCODONE-ACETAMINOPHEN 5-325 MG PO TABS
1.0000 | ORAL_TABLET | ORAL | Status: DC | PRN
  Administered 2020-04-08 – 2020-04-10 (×4): 2 via ORAL
  Administered 2020-04-11 (×2): 1 via ORAL
  Administered 2020-04-11 (×2): 2 via ORAL
  Filled 2020-04-08 (×3): qty 2
  Filled 2020-04-08: qty 1
  Filled 2020-04-08 (×4): qty 2

## 2020-04-08 MED ORDER — NALOXONE HCL 0.4 MG/ML IJ SOLN: 0.4000 mg | INTRAMUSCULAR | Status: DC | PRN

## 2020-04-08 MED ORDER — SODIUM CHLORIDE 0.9 % IV SOLN: INTRAVENOUS | Status: DC

## 2020-04-08 MED ORDER — DIPHENHYDRAMINE HCL 12.5 MG/5ML PO ELIX: 12.5000 mg | ORAL_SOLUTION | Freq: Four times a day (QID) | ORAL | Status: DC | PRN

## 2020-04-08 MED ORDER — ONDANSETRON HCL 4 MG/2ML IJ SOLN
4.0000 mg | Freq: Four times a day (QID) | INTRAMUSCULAR | Status: DC | PRN
  Administered 2020-04-09: 4 mg via INTRAVENOUS
  Filled 2020-04-08: qty 2

## 2020-04-08 NOTE — Progress Notes (Signed)
Subjective/Chief Complaint:  1 - Rt Ureteral / Renal Stones with Hydronephrosis - 49mm Rt UVJ (intramural ureter) and 82mm Rt mid papillary tip stones on CT 03/2020 on eval flank pain. Cr 1.09. UA without infectious parameters. In early 3rd trimester pregnancy. STarted on medical therapy with tamsulosin.   Today " Jenny Barnes " is stable. Not able to keep much down, certainly not enough to maintain nutrition / hydration. She passed a small amount of substance in urine overnight that is organic and not stone material. Doing very good job with straining samples.    Objective: Vital signs in last 24 hours: Temp:  [98 F (36.7 C)-98.8 F (37.1 C)] 98.1 F (36.7 C) (08/12 1534) Pulse Rate:  [76-97] 97 (08/12 1534) Resp:  [13-20] 16 (08/12 1534) BP: (94-109)/(52-63) 109/58 (08/12 1534) SpO2:  [93 %-99 %] 96 % (08/12 1534) Last BM Date: 04/05/20  Intake/Output from previous day: 08/11 0701 - 08/12 0700 In: 3535.6 [P.O.:1710; I.V.:1825.6] Out: 3150 [Urine:3050; Emesis/NG output:100] Intake/Output this shift: Total I/O In: 919.6 [P.O.:530; I.V.:389.6] Out: 1525 [Urine:1525]   Physical Exam Vitals reviewed.  Constitutional:      Comments: Very pleasant. Tryign to eat, but cannot. Mom at bedside as well.  HENT:     Head: Normocephalic.     Nose: Nose normal.  Eyes:     Pupils: Pupils are equal, round, and reactive to light.  Cardiovascular:     Rate and Rhythm: Normal rate.     Pulses: Normal pulses.  Pulmonary:     Effort: Pulmonary effort is normal.  Abdominal:     General: Abdomen is flat.  Genitourinary:    Comments: Moderate Rt CVAT at present. Gravid Uterus.  Musculoskeletal:        General: Normal range of motion.     Cervical back: Normal range of motion.  Skin:    General: Skin is warm.  Neurological:     General: No focal deficit present.     Mental Status: She is alert.  Psychiatric:        Mood and Affect: Mood normal.   Lab Results:  Recent Labs     04/05/20 1914 04/06/20 0557  WBC 8.8 8.7  HGB 10.8* 9.7*  HCT 32.3* 29.4*  PLT 179 142*   BMET Recent Labs    04/05/20 1914 04/06/20 0557  NA 132* 131*  K 3.5 3.5  CL 102 102  CO2 22 23  GLUCOSE 87 82  BUN 10 9  CREATININE 1.05* 1.09*  CALCIUM 8.3* 8.1*   PT/INR No results for input(s): LABPROT, INR in the last 72 hours. ABG No results for input(s): PHART, HCO3 in the last 72 hours.  Invalid input(s): PCO2, PO2  Studies/Results: No results found.  Anti-infectives: Anti-infectives (From admission, onward)   Start     Dose/Rate Route Frequency Ordered Stop   04/06/20 2200  cephALEXin (KEFLEX) capsule 500 mg     Discontinue     500 mg Oral Every 12 hours 04/06/20 2005        Assessment/Plan:  1 - Rt Ureteral / Renal Stones with Hydronephrosis - Pt with good understanding of competing risks of surgery v. Stone and elects current plan of medical therapy for now and ureteroscopy tomorrow Friday with goal of stone free if does not pass prior. We have began planning staffing / equipment logistics for this and equipment will be transferred here prior and 2 Urology staff members have agreed to be on site to assist intra-operatively.  Risks,  benefits, alternatives, expected peri-op course incluidng possible need for temporary stenting or staged approach discussed as well as some, but low, fetal risk. Also significant fetal risk UNtreated as colic can serve as nidus for pre-term labor and suboptimal nutrition.   I anticipate she should be able to go home Saturday.   Continue proph keflex.  Appreciate OB service comanagment.   Please call me directly with questions anytime.  Sebastian Ache 04/08/2020

## 2020-04-08 NOTE — Progress Notes (Addendum)
Patient ID: Blakelee Allington, female   DOB: 15-Jan-1996, 24 y.o.   MRN: [id number]  HD#3  24 yo G2P1001 @ 29+6 admitted with nephrolithiosis and severe right hydronephosis  S: Pt feeling a little bit better this morning. Overnight, with straining or urine a small fragment of possible stone was collected. O:  Vitals:   04/08/20 0609 04/08/20 0739  BP:  (!) 98/55  Pulse:  76  Resp: 14 15  Temp:  98.3 F (36.8 C)  SpO2: 95% 99%   AOx3, CO2 nasal canula in place abd soft NST from last night reactive, cat 1  A/P 1) Possible passable of stone. TC with Dr. Berneice Heinrich, he will come see pt this afternoon and determine it it looks like she passed stone. As precaution, will schedule her for procedure tomorrow. 2) FWB reassuring 3) Pt hasn't required PCA pain meds overnight. Will D/C PCA for now, ad percocet for pain control

## 2020-04-08 NOTE — Progress Notes (Signed)
After straining urine very, tiny solid particle noted in strainer.

## 2020-04-09 ENCOUNTER — Inpatient Hospital Stay (HOSPITAL_COMMUNITY): Payer: Medicaid Other | Admitting: Certified Registered Nurse Anesthetist

## 2020-04-09 ENCOUNTER — Inpatient Hospital Stay (HOSPITAL_COMMUNITY): Payer: Medicaid Other

## 2020-04-09 ENCOUNTER — Encounter (HOSPITAL_COMMUNITY)
Admission: AD | Disposition: A | Discharge status: Home / Self Care | Payer: Self-pay | Source: Home / Self Care | Attending: Obstetrics and Gynecology

## 2020-04-09 ENCOUNTER — Encounter (HOSPITAL_COMMUNITY): Payer: Self-pay | Admitting: Obstetrics and Gynecology

## 2020-04-09 DIAGNOSIS — N132 Hydronephrosis with renal and ureteral calculous obstruction: Secondary | ICD-10-CM | POA: Diagnosis present

## 2020-04-09 HISTORY — PX: CYSTOSCOPY WITH RETROGRADE PYELOGRAM, URETEROSCOPY AND STENT PLACEMENT: SHX5789

## 2020-04-09 LAB — GLUCOSE, CAPILLARY: Glucose-Capillary: 77 mg/dL (ref 70–99)

## 2020-04-09 SURGERY — CYSTOURETEROSCOPY, WITH RETROGRADE PYELOGRAM AND STENT INSERTION
Anesthesia: General | Laterality: Right

## 2020-04-09 MED ORDER — BELLADONNA ALKALOIDS-OPIUM 16.2-60 MG RE SUPP
1.0000 | Freq: Once | RECTAL | Status: DC
  Filled 2020-04-09: qty 1

## 2020-04-09 MED ORDER — SUCCINYLCHOLINE CHLORIDE 200 MG/10ML IV SOSY
PREFILLED_SYRINGE | INTRAVENOUS | Status: DC | PRN
  Administered 2020-04-09: 80 mg via INTRAVENOUS

## 2020-04-09 MED ORDER — DIPHENHYDRAMINE HCL 12.5 MG/5ML PO ELIX: 12.5000 mg | ORAL_SOLUTION | Freq: Four times a day (QID) | ORAL | Status: DC | PRN

## 2020-04-09 MED ORDER — NALOXONE HCL 0.4 MG/ML IJ SOLN: 0.4000 mg | INTRAMUSCULAR | Status: DC | PRN

## 2020-04-09 MED ORDER — PROPOFOL 10 MG/ML IV BOLUS
INTRAVENOUS | Status: DC | PRN
  Administered 2020-04-09: 160 mg via INTRAVENOUS

## 2020-04-09 MED ORDER — SODIUM CHLORIDE 0.9% FLUSH: 9.0000 mL | INTRAVENOUS | Status: DC | PRN

## 2020-04-09 MED ORDER — LIDOCAINE HCL URETHRAL/MUCOSAL 2 % EX GEL
CUTANEOUS | Status: AC
  Filled 2020-04-09: qty 11

## 2020-04-09 MED ORDER — ROCURONIUM BROMIDE 10 MG/ML (PF) SYRINGE
PREFILLED_SYRINGE | INTRAVENOUS | Status: AC
  Filled 2020-04-09: qty 10

## 2020-04-09 MED ORDER — FENTANYL CITRATE (PF) 100 MCG/2ML IJ SOLN
25.0000 ug | INTRAMUSCULAR | Status: DC | PRN
  Administered 2020-04-09 (×3): 50 ug via INTRAVENOUS

## 2020-04-09 MED ORDER — CEFAZOLIN SODIUM-DEXTROSE 2-4 GM/100ML-% IV SOLN
2.0000 g | INTRAVENOUS | Status: AC
  Administered 2020-04-09: 2 g via INTRAVENOUS

## 2020-04-09 MED ORDER — ONDANSETRON HCL 4 MG/2ML IJ SOLN
INTRAMUSCULAR | Status: AC
  Filled 2020-04-09: qty 2

## 2020-04-09 MED ORDER — OXYCODONE HCL 5 MG PO TABS: 5.0000 mg | ORAL_TABLET | Freq: Once | ORAL | Status: DC | PRN

## 2020-04-09 MED ORDER — CEFAZOLIN SODIUM-DEXTROSE 2-4 GM/100ML-% IV SOLN
INTRAVENOUS | Status: AC
  Filled 2020-04-09: qty 100

## 2020-04-09 MED ORDER — IOHEXOL 300 MG/ML  SOLN
INTRAMUSCULAR | Status: DC | PRN
  Administered 2020-04-09: 50 mL

## 2020-04-09 MED ORDER — DIPHENHYDRAMINE HCL 50 MG/ML IJ SOLN: 12.5000 mg | Freq: Four times a day (QID) | INTRAMUSCULAR | Status: DC | PRN

## 2020-04-09 MED ORDER — FENTANYL CITRATE (PF) 250 MCG/5ML IJ SOLN
INTRAMUSCULAR | Status: AC
  Filled 2020-04-09: qty 5

## 2020-04-09 MED ORDER — LIDOCAINE 2% (20 MG/ML) 5 ML SYRINGE
INTRAMUSCULAR | Status: AC
  Filled 2020-04-09: qty 5

## 2020-04-09 MED ORDER — CHLORHEXIDINE GLUCONATE 0.12 % MT SOLN: 15.0000 mL | Freq: Once | OROMUCOSAL | Status: AC

## 2020-04-09 MED ORDER — OXYCODONE HCL 5 MG/5ML PO SOLN: 5.0000 mg | Freq: Once | ORAL | Status: DC | PRN

## 2020-04-09 MED ORDER — FENTANYL CITRATE (PF) 100 MCG/2ML IJ SOLN
INTRAMUSCULAR | Status: AC
  Filled 2020-04-09: qty 2

## 2020-04-09 MED ORDER — FENTANYL CITRATE (PF) 100 MCG/2ML IJ SOLN
50.0000 ug | Freq: Once | INTRAMUSCULAR | Status: AC
  Administered 2020-04-09: 50 ug via INTRAVENOUS
  Filled 2020-04-09: qty 2

## 2020-04-09 MED ORDER — ONDANSETRON HCL 4 MG/2ML IJ SOLN
4.0000 mg | Freq: Four times a day (QID) | INTRAMUSCULAR | Status: DC | PRN
  Administered 2020-04-10: 4 mg via INTRAVENOUS
  Filled 2020-04-09: qty 2

## 2020-04-09 MED ORDER — CHLORHEXIDINE GLUCONATE 0.12 % MT SOLN
OROMUCOSAL | Status: AC
  Administered 2020-04-09: 15 mL via OROMUCOSAL
  Filled 2020-04-09: qty 15

## 2020-04-09 MED ORDER — DEXAMETHASONE SODIUM PHOSPHATE 10 MG/ML IJ SOLN
INTRAMUSCULAR | Status: AC
  Filled 2020-04-09: qty 1

## 2020-04-09 MED ORDER — ONDANSETRON HCL 4 MG/2ML IJ SOLN
INTRAMUSCULAR | Status: DC | PRN
  Administered 2020-04-09: 4 mg via INTRAVENOUS

## 2020-04-09 MED ORDER — ONDANSETRON HCL 4 MG/2ML IJ SOLN: 4.0000 mg | Freq: Once | INTRAMUSCULAR | Status: DC | PRN

## 2020-04-09 MED ORDER — MEPIVACAINE HCL (PF) 2 % IJ SOLN
INTRAMUSCULAR | Status: DC | PRN
Start: 2020-04-09 — End: 2020-04-09
  Administered 2020-04-09: 70 mg via EPIDURAL

## 2020-04-09 MED ORDER — 0.9 % SODIUM CHLORIDE (POUR BTL) OPTIME
TOPICAL | Status: DC | PRN
  Administered 2020-04-09: 1000 mL

## 2020-04-09 MED ORDER — FENTANYL CITRATE (PF) 100 MCG/2ML IJ SOLN
INTRAMUSCULAR | Status: DC | PRN
  Administered 2020-04-09: 25 ug via INTRAVENOUS

## 2020-04-09 MED ORDER — HYDROMORPHONE 1 MG/ML IV SOLN
INTRAVENOUS | Status: DC
  Administered 2020-04-09: 30 mg via INTRAVENOUS
  Administered 2020-04-10: 1.8 mg via INTRAVENOUS
  Administered 2020-04-10: 6.9 mg via INTRAVENOUS
  Administered 2020-04-10: 1.2 mg via INTRAVENOUS
  Administered 2020-04-10: 2.7 mg via INTRAVENOUS
  Filled 2020-04-09: qty 30

## 2020-04-09 MED ORDER — SODIUM CHLORIDE 0.9 % IR SOLN
Status: DC | PRN
  Administered 2020-04-09: 3000 mL

## 2020-04-09 SURGICAL SUPPLY — 39 items
BAG URO CATCHER STRL LF (MISCELLANEOUS) ×3 IMPLANT
BASKET LASER NITINOL 1.9FR (BASKET) ×3 IMPLANT
BASKET STNLS GEMINI 4WIRE 3FR (BASKET) IMPLANT
BASKET ZERO TIP NITINOL 2.4FR (BASKET) IMPLANT
CATH INTERMIT  6FR 70CM (CATHETERS) ×3 IMPLANT
CATH URET 5FR 28IN CONE TIP (BALLOONS)
CATH URET 5FR 70CM CONE TIP (BALLOONS) IMPLANT
COVER SURGICAL LIGHT HANDLE (MISCELLANEOUS) ×3 IMPLANT
COVER WAND RF STERILE (DRAPES) ×3 IMPLANT
ELECT REM PT RETURN 9FT ADLT (ELECTROSURGICAL)
ELECTRODE REM PT RTRN 9FT ADLT (ELECTROSURGICAL) IMPLANT
FIBER LASER TRAC TIP (UROLOGICAL SUPPLIES) IMPLANT
GLOVE BIO SURGEON STRL SZ7.5 (GLOVE) ×3 IMPLANT
GLOVE BIOGEL PI IND STRL 6.5 (GLOVE) ×1 IMPLANT
GLOVE BIOGEL PI INDICATOR 6.5 (GLOVE) ×2
GOWN STRL REUS W/ TWL LRG LVL3 (GOWN DISPOSABLE) ×1 IMPLANT
GOWN STRL REUS W/TWL LRG LVL3 (GOWN DISPOSABLE) ×3
GUIDEWIRE ANG ZIPWIRE 038X150 (WIRE) ×6 IMPLANT
GUIDEWIRE STR DUAL SENSOR (WIRE) ×6 IMPLANT
IV NS 1000ML (IV SOLUTION) ×3
IV NS 1000ML BAXH (IV SOLUTION) ×1 IMPLANT
IV NS IRRIG 3000ML ARTHROMATIC (IV SOLUTION) ×3 IMPLANT
KIT BALLIN UROMAX 15FX10 (LABEL) IMPLANT
KIT BALLN UROMAX 15FX4 (MISCELLANEOUS) IMPLANT
KIT BALLN UROMAX 26 75X4 (MISCELLANEOUS)
KIT TURNOVER KIT B (KITS) ×3 IMPLANT
MANIFOLD NEPTUNE II (INSTRUMENTS) ×6 IMPLANT
NS IRRIG 1000ML POUR BTL (IV SOLUTION) ×6 IMPLANT
PACK CYSTO (CUSTOM PROCEDURE TRAY) ×3 IMPLANT
SET HIGH PRES BAL DIL (LABEL)
SET IRRIG Y TYPE TUR BLADDER L (SET/KITS/TRAYS/PACK) ×3 IMPLANT
SHEATH URETERAL 12FRX28CM (UROLOGICAL SUPPLIES) ×3 IMPLANT
SOL PREP POV-IOD 4OZ 10% (MISCELLANEOUS) ×3 IMPLANT
SYR 10ML LL (SYRINGE) ×3 IMPLANT
TUBE CONNECTING 12'X1/4 (SUCTIONS)
TUBE CONNECTING 12X1/4 (SUCTIONS) IMPLANT
TUBE FEEDING 8FR 16IN STR KANG (MISCELLANEOUS) ×3 IMPLANT
WATER STERILE IRR 1000ML POUR (IV SOLUTION) ×3 IMPLANT
WATER STERILE IRR 3000ML UROMA (IV SOLUTION) IMPLANT

## 2020-04-09 NOTE — Progress Notes (Signed)
Md notified PT in a lot of pain  10/10 . Order given will continue to monitor closely.

## 2020-04-09 NOTE — Anesthesia Preprocedure Evaluation (Addendum)
Anesthesia Evaluation  Patient identified by MRN, date of birth, ID band Patient awake    Reviewed: Allergy & Precautions, NPO status , Patient's Chart, lab work & pertinent test results  History of Anesthesia Complications Negative for: history of anesthetic complications  Airway Mallampati: II  TM Distance: >3 FB Neck ROM: Full    Dental  (+) Teeth Intact   Pulmonary neg pulmonary ROS,    Pulmonary exam normal        Cardiovascular negative cardio ROS Normal cardiovascular exam     Neuro/Psych negative neurological ROS  negative psych ROS   GI/Hepatic negative GI ROS, Neg liver ROS,   Endo/Other  Hypothyroidism   Renal/GU Renal disease (ureteral stone)  negative genitourinary   Musculoskeletal negative musculoskeletal ROS (+)   Abdominal   Peds  Hematology negative hematology ROS (+)   Anesthesia Other Findings   Reproductive/Obstetrics (+) Pregnancy (30 weeks)                            Anesthesia Physical Anesthesia Plan  ASA: II  Anesthesia Plan: Spinal   Post-op Pain Management:    Induction:   PONV Risk Score and Plan: 2 and Treatment may vary due to age or medical condition and Ondansetron  Airway Management Planned: Natural Airway  Additional Equipment: None  Intra-op Plan:   Post-operative Plan:   Informed Consent: I have reviewed the patients History and Physical, chart, labs and discussed the procedure including the risks, benefits and alternatives for the proposed anesthesia with the patient or authorized representative who has indicated his/her understanding and acceptance.       Plan Discussed with:   Anesthesia Plan Comments:         Anesthesia Quick Evaluation

## 2020-04-09 NOTE — Final Progress Note (Signed)
Notified Dr Laverle Patter On call Urologist about pt pain 10/10 and percocet  Not helping.

## 2020-04-09 NOTE — Progress Notes (Signed)
[facility] nurse called to doppler a FHR after surgery . FHR  138

## 2020-04-09 NOTE — Progress Notes (Signed)
Pt PCA dilaudid was restarted, pt has a pain of 10/10. Pt wants her pain to be under control before being  [place] monitored.Will continue to monitor and support pt

## 2020-04-09 NOTE — Progress Notes (Signed)
Patient is in recovery, called Dr. Dareen Piano for fetal heart tone monitoring orders. [facility] aware and on their way.  Hermina Barters, RN

## 2020-04-09 NOTE — Anesthesia Procedure Notes (Signed)
Spinal  Patient location during procedure: OR Staffing Performed: anesthesiologist  Anesthesiologist: Dafne Nield E, MD Preanesthetic Checklist Completed: patient identified, IV checked, risks and benefits discussed, surgical consent, monitors and equipment checked, pre-op evaluation and timeout performed Spinal Block Patient position: sitting Prep: DuraPrep and site prepped and draped Patient monitoring: continuous pulse ox, blood pressure and heart rate Approach: midline Location: L3-4 Injection technique: single-shot Needle Needle type: Pencan  Needle gauge: 24 G Needle length: 9 cm Additional Notes Functioning IV was confirmed and monitors were applied. Sterile prep and drape, including hand hygiene and sterile gloves were used. The patient was positioned and the spine was prepped. The skin was anesthetized with lidocaine.  Free flow of clear CSF was obtained prior to injecting local anesthetic into the CSF. The needle was carefully withdrawn. The patient tolerated the procedure well.      

## 2020-04-09 NOTE — Brief Op Note (Signed)
04/09/2020  4:21 PM  PATIENT:  Jenny Barnes  24 y.o. female  PRE-OPERATIVE DIAGNOSIS:  RIGHT URETERAL STONE IN 3RD TRIMESTER OF PREGNANCY  POST-OPERATIVE DIAGNOSIS:  RIGHT URETERAL STONE IN 3RD TRIMESTER OF PREGNANCY  PROCEDURE:  Procedure(s): URETEROSCOPY, BASKETING OF STONE & DOUBLE J. STENT PLACEMENT (Right)  SURGEON:  Surgeon(s) and Role:    * Sebastian Ache, MD - Primary  PHYSICIAN ASSISTANT:   ASSISTANTS: none   ANESTHESIA:   spinal and general  EBL:  minimal   BLOOD ADMINISTERED:none  DRAINS: none   LOCAL MEDICATIONS USED:  NONE  SPECIMEN:  Source of Specimen:  Rt ureteral and renal stone fragments  DISPOSITION OF SPECIMEN:  [facility] for compositional analysis  COUNTS:  YES  TOURNIQUET:  * No tourniquets in log *  DICTATION: .Other Dictation: Dictation Number  [id number]  PLAN OF CARE: Admit to inpatient   PATIENT DISPOSITION:  PACU - hemodynamically stable.   Delay start of Pharmacological VTE agent (>24hrs) due to surgical blood loss or risk of bleeding: not applicable

## 2020-04-09 NOTE — Progress Notes (Signed)
Day of Surgery   Subjective/Chief Complaint:   1 - Rt Ureteral / Renal Stones with Hydronephrosis - 32mm Rt UVJ (intramural ureter) and 58mm Rt mid papillary tip stones on CT 03/2020 on eval flank pain. Cr 1.09. UA without infectious parameters. In early 3rd trimester pregnancy. STarted on medical therapy with tamsulosin.   Today " Sammy " is stable. No interval stone passage. Still remains with significant colic. Fetal heart tones reassuring. NPO since 11:30 last night.    Objective: Vital signs in last 24 hours: Temp:  [97.6 F (36.4 C)-98.1 F (36.7 C)] 98.1 F (36.7 C) (08/13 1126) Pulse Rate:  [72-97] 81 (08/13 1126) Resp:  [11-18] 16 (08/13 1126) BP: (93-109)/(48-70) 104/64 (08/13 1126) SpO2:  [93 %-100 %] 100 % (08/13 1126) Last BM Date: 04/05/20  Intake/Output from previous day: 08/12 0701 - 08/13 0700 In: 1399.6 [P.O.:1010; I.V.:389.6] Out: 2425 [Urine:2425] Intake/Output this shift: Total I/O In: 9 [P.O.:9] Out: 300 [Urine:300]    Physical Exam Vitals reviewed.  Constitutional:      Comments: Very pleasant in OR holding 37.  HENT:     Head: Normocephalic.     Nose: Nose normal.  Eyes:     Pupils: Pupils are equal, round, and reactive to light.  Cardiovascular:     Rate and Rhythm: Normal rate.     Pulses: Normal pulses.  Pulmonary:     Effort: Pulmonary effort is normal.  Abdominal:     General: Abdomen is flat.  Genitourinary:    Comments: Mild Rt CVAT at present. Gravid Uterus.  Musculoskeletal:        General: Normal range of motion.     Cervical back: Normal range of motion.  Skin:    General: Skin is warm.  Neurological:     General: No focal deficit present.     Mental Status: She is alert.  Psychiatric:        Mood and Affect: Mood normal.   Lab Results:  No results for input(s): WBC, HGB, HCT, PLT in the last 72 hours. BMET No results for input(s): NA, K, CL, CO2, GLUCOSE, BUN, CREATININE, CALCIUM in the last 72 hours. PT/INR No  results for input(s): LABPROT, INR in the last 72 hours. ABG No results for input(s): PHART, HCO3 in the last 72 hours.  Invalid input(s): PCO2, PO2  Studies/Results: No results found.  Anti-infectives: Anti-infectives (From admission, onward)   Start     Dose/Rate Route Frequency Ordered Stop   04/09/20 1440  ceFAZolin (ANCEF) 2-4 GM/100ML-% IVPB       Note to Pharmacy: Launa Flight   : cabinet override      04/09/20 1440 04/10/20 0244   04/09/20 1437  ceFAZolin (ANCEF) IVPB 2g/100 mL premix     Discontinue     2 g 200 mL/hr over 30 Minutes Intravenous 30 min pre-op 04/09/20 1437     04/06/20 2200  [MAR Hold]  cephALEXin (KEFLEX) capsule 500 mg     Discontinue     (MAR Hold since Fri 04/09/2020 at 1435.Hold Reason: Transfer to a Procedural area.)   500 mg Oral Every 12 hours 04/06/20 2005        Assessment/Plan:  Proceed as planned with cystoscopy , RIGHT ureteroscopy with goal of stone free. Risks, benefits, alternatives, expected peri-op course discussed previously and reiterated today including maternal and fetal risks with and without surgery. She voiced understanding and desires to proceed.   Sebastian Ache 04/09/2020

## 2020-04-09 NOTE — Anesthesia Procedure Notes (Signed)
Procedure Name: Intubation Performed by: Deloma Spindle H, CRNA Pre-anesthesia Checklist: Patient identified, Emergency Drugs available, Suction available and Patient being monitored Patient Re-evaluated:Patient Re-evaluated prior to induction Oxygen Delivery Method: Circle System Utilized Preoxygenation: Pre-oxygenation with 100% oxygen Induction Type: IV induction and Rapid sequence Laryngoscope Size: Miller and 2 Grade View: Grade I Tube type: Oral Tube size: 7.0 mm Number of attempts: 1 Airway Equipment and Method: Stylet and Oral airway Placement Confirmation: ETT inserted through vocal cords under direct vision,  positive ETCO2 and breath sounds checked- equal and bilateral Secured at: 21 cm Tube secured with: Tape Dental Injury: Teeth and Oropharynx as per pre-operative assessment        

## 2020-04-09 NOTE — Transfer of Care (Signed)
Immediate Anesthesia Transfer of Care Note  Patient: Deyona Soza  Procedure(s) Performed: URETEROSCOPY, BASKETING OF STONE & DOUBLE J. STENT PLACEMENT (Right )  Patient Location: PACU  Anesthesia Type:General  Level of Consciousness: drowsy  Airway & Oxygen Therapy: Patient Spontanous Breathing  Post-op Assessment: Report given to RN and Post -op Vital signs reviewed and stable  Post vital signs: Reviewed and stable  Last Vitals:  Vitals Value Taken Time  BP 112/73 04/09/20 1639  Temp    Pulse 92 04/09/20 1639  Resp 19 04/09/20 1639  SpO2 97 % 04/09/20 1639  Vitals shown include unvalidated device data.  Last Pain:  Vitals:   04/09/20 1126  TempSrc: Oral  PainSc:       Patients Stated Pain Goal: 7 (04/07/20 2223)  Complications: No complications documented.

## 2020-04-09 NOTE — Final Progress Note (Signed)
Notified Dr Dareen Piano of pt pain 10/10.

## 2020-04-09 NOTE — Progress Notes (Signed)
Pt states that she is very tired from recent Phenergan. She is to go to OR at 1300. FHTs are with decreased variability most like to narcotics and phenergan. No decels.  PLAN/ Will reevaluate pt after surgery

## 2020-04-10 MED ORDER — HYDROMORPHONE HCL 2 MG PO TABS
2.0000 mg | ORAL_TABLET | Freq: Once | ORAL | Status: AC
  Administered 2020-04-10: 2 mg via ORAL
  Filled 2020-04-10: qty 1

## 2020-04-10 MED ORDER — HYDROMORPHONE HCL 2 MG PO TABS
2.0000 mg | ORAL_TABLET | ORAL | Status: DC | PRN
  Administered 2020-04-10 – 2020-04-11 (×6): 2 mg via ORAL
  Filled 2020-04-10 (×7): qty 1

## 2020-04-10 NOTE — Progress Notes (Signed)
POD#1 Pt underwent removal of stone and placement of stent yesterday. She has significant flank pain and pelvic pressure post op. She states greater than before the surgery. Per urology her procedure was uncomplicated. Discussed case with urology.They state that she may have significant edema in ureter post op.  Plan/ Will use tylenol           Will attempt to switch to po dilaudid.           Continue flomax

## 2020-04-11 ENCOUNTER — Inpatient Hospital Stay (HOSPITAL_COMMUNITY): Payer: Medicaid Other

## 2020-04-11 MED ORDER — HYDROMORPHONE HCL 2 MG PO TABS
2.0000 mg | ORAL_TABLET | ORAL | Status: DC | PRN
  Administered 2020-04-11 – 2020-04-12 (×5): 2 mg via ORAL
  Filled 2020-04-11 (×6): qty 1

## 2020-04-11 NOTE — Progress Notes (Signed)
Pt returned from MRI and is very tearful. Asking for more pain medication but it is not time for any. Carmelina Dane, RN

## 2020-04-11 NOTE — Progress Notes (Signed)
Patient has been crying and in pain after several hours. She finally fell asleep and was not woke up for fetal monitoring. Carmelina Dane, RN

## 2020-04-11 NOTE — Progress Notes (Signed)
Patient ID: Jenny Barnes, female   DOB: 1995-11-14, 24 y.o.   MRN: [id number]  .2 Days Post-Op Subjective: Pt has had continued uncontrolled pain since her ureteroscopic procedure on Friday.  I spoke with Dr. Dareen Piano yesterday and there were attempts to transition her to oral pain medication which has proved unsuccessful.  Talking with the patient, she does have significant right flank pain but also describes pain from her hips bilaterally down to her knees and even calves.  This is particularly exacerbated with position or movement.  She did have an attempted spinal anesthetic on Friday that required conversion to general anesthesia.  Objective: Vital signs in last 24 hours: Temp:  [98 F (36.7 C)-99.4 F (37.4 C)] 98 F (36.7 C) (08/15 1137) Pulse Rate:  [81-100] 90 (08/15 1137) Resp:  [16-20] 16 (08/15 1137) BP: (99-119)/(49-72) 101/57 (08/15 1137) SpO2:  [94 %-99 %] 96 % (08/15 1137)  Intake/Output from previous day: 08/14 0701 - 08/15 0700 In: 2454 [P.O.:600; I.V.:1854] Out: 1275 [Urine:1275] Intake/Output this shift: Total I/O In: -  Out: 1450 [Urine:1450]  Physical Exam:  General: Alert and oriented Abdomen: Gravid abdomen, severe right CVAT, no back abnormalities, cannot palpate a hematoma  Neuro: Normal sensation of lower extremities. Mild weakness of proximal and distal lower extremities either related to weakness or pain.  Studies/Results:   Assessment/Plan: 1) Right ureteral stone: This has been removed.  She very well may have flank pain related to her stent although may have another source for pain (see below).  I had a long discussion with the patient and her mother and offered to remove her stent this morning although there is a risk of having persistent ureteral edema that may cause persistent pain and could lead to nephrostomy placement eventually if pain still not controlled.  The other option would be to continue to work on pain management with oral  medication and see if her pain control improves over the course of the day.  If still not improved by tomorrow, would give further consideration to stent removal at that time.  2) Back/lower extremity pain: She describes pain symptoms that also do not appear to be related to her stent.  She describes pain symptoms in her lower back, pelvis with radiation down her lower extremities that is exacerbated with position and movement.  Considering attempted spinal anesthetic on Friday, may be beneficial for anesthesia to evaluate to rule out epidural hematoma.  Discussed with Dr. Dareen Piano.   LOS: 6 days   Crecencio Mc 04/11/2020, 11:39 AM

## 2020-04-11 NOTE — Progress Notes (Signed)
Pt is requesting more pain medication. Dr. Dareen Piano notified, RN instructed to change the frequency of Dilaudid from q4hours to q3hours. RN also instructed to discontinue Percocet order.

## 2020-04-11 NOTE — Anesthesia Postprocedure Evaluation (Signed)
Anesthesia Post Note  Patient: Jenny Barnes  Procedure(s) Performed: URETEROSCOPY, BASKETING OF STONE & DOUBLE J. STENT PLACEMENT (Right )     Patient location during evaluation: OB High Risk Anesthesia Type: Spinal and General Level of consciousness: awake and alert Pain management: pain level not controlled Vital Signs Assessment: post-procedure vital signs reviewed and stable Respiratory status: spontaneous breathing and respiratory function stable Cardiovascular status: blood pressure returned to baseline and stable Postop Assessment: no apparent nausea or vomiting Anesthetic complications: no Comments: Patient had spinal for procedure that did not achieve high enough sensory level. Converted to general anesthesia as patient was becoming uncomfortable during surgery. Per PACU report, spinal was receding as patient's sensation and motor function was recovering as spinal wore off, though patient was having significant back pain postop. Asked to see patient by OB on POD#2 for continued pain and weakness. See consult note for further details. MRI ordered to rule out spinal/epidural hematoma.   No complications documented.  Last Vitals:  Vitals:   04/11/20 0907 04/11/20 1137  BP: (!) 105/58 (!) 101/57  Pulse: 100 90  Resp: 16 16  Temp: 37.1 C 36.7 C  SpO2: 97% 96%    Last Pain:  Vitals:   04/11/20 1137  TempSrc: Oral  PainSc:    Pain Goal: Patients Stated Pain Goal: 6 (04/11/20 1014)                 Beryle Lathe

## 2020-04-11 NOTE — Consult Note (Signed)
Asked by Dr. Dareen Piano to see patient for concerns of lower extremity weakness. Patient underwent urology procedure on 04/09/20 for kidney stone. Patient received a spinal anesthetic. Per notes, spinal procedure was uncomplicated. However, patient did not obtain sufficient analgesia/anesthesia from said spinal and thus, underwent general anesthesia for the remainder of the urologic procedure.   Patient now complaining of significant back pain. Noted to also have lower extremity weakness bilaterally with diminished sensation. Subjectively, patient states that she has more numbness on left lower extremity than right, but more weakness on her right lower extremity than left. She cannot recall if these symptoms were noted immediately upon awakening from the general anesthetic, or if they have developed postoperatively. Has been able to get up from bed to use the restroom with assistance, but feels generalized fatigue and weakness. Denies any bowel or bladder incontinence. She also denies any anticoagulant use prior to or since the spinal, no history of any bleeding problems in the past. Uncomplicated epidural performed for a previous pregnancy in January 2020.  Exam notable for diminished sensation to sharp objects on bilateral lower extremities from waist down to feet. 4/5 plantar and dorsiflexion bilateral feet, 2-3/5 strength bilateral hip flexion and knee extension.   Discussed with patient and Dr. Dareen Piano via telephone. Will order stat lumbar MRI to rule out spinal/epidural hematoma given exam findings. Order placed and radiology notified of order via telephone.

## 2020-04-11 NOTE — Progress Notes (Signed)
Patient reporting uncontrolled pain since her ureteroscopic procedure on Friday. Rates pain 10/10 with no relief with pain medication. Patient also describes pain down to her knees and calves. Extremities appear normal. Anesthesia aware. Patient will have an MRI today. Carmelina Dane RN

## 2020-04-12 ENCOUNTER — Encounter (HOSPITAL_COMMUNITY): Payer: Self-pay | Admitting: Urology

## 2020-04-12 DIAGNOSIS — G5710 Meralgia paresthetica, unspecified lower limb: Secondary | ICD-10-CM

## 2020-04-12 MED ORDER — PROMETHAZINE HCL 25 MG PO TABS
25.0000 mg | ORAL_TABLET | Freq: Four times a day (QID) | ORAL | Status: DC | PRN
  Administered 2020-04-13: 25 mg via ORAL
  Filled 2020-04-12: qty 1

## 2020-04-12 MED ORDER — ACETAMINOPHEN 500 MG PO TABS
1000.0000 mg | ORAL_TABLET | Freq: Three times a day (TID) | ORAL | Status: DC
  Administered 2020-04-12 – 2020-04-14 (×5): 1000 mg via ORAL
  Filled 2020-04-12 (×5): qty 2

## 2020-04-12 NOTE — Progress Notes (Signed)
Patient ID: Jenny Barnes, female   DOB: 07-22-1996, 24 y.o.   MRN: [id number]  HD#7  24 yo G2P1001 @ 30+3 admitted with nephrolithiosis and severe right hydronephosis  S: Patient complaining that pain medication is not lasting long enough to control her pain.  Medication well the last for approximately an hour and then pavers turns 10 out of 10.  Patient states she is able to ambulate but then her legs will turn to Jell-O unexpectedly.  She notes sharp shooting pains in her anterior thighs down to her knees.  He states that her legs will also become intermittently numb.  She reports some dysuria, but denies incontinence. O:  Vitals:   04/11/20 2301 04/12/20 0459 04/12/20 0944 04/12/20 1208  BP:  103/60 (!) 97/46 (!) 102/58  Pulse: (!) 101 92 91 96  Resp: 18 18 18 18   Temp: 98.9 F (37.2 C) 97.7 F (36.5 C) 98.7 F (37.1 C) 98.8 F (37.1 C)  TempSrc: Oral Oral Oral Oral  SpO2: 97% 97% 97% 95%  Weight:      Height:       AOX3, NAD Abd Soft DTRs @ knee 2+, no clonus  MR LUMBAR SPINE WO CONTRAST CLINICAL DATA:  Lower extremity weakness and sensory disturbance, rule out spinal hematoma.  EXAM: MRI LUMBAR SPINE WITHOUT CONTRAST  TECHNIQUE: Multiplanar, multisequence MR imaging of the lumbar spine was performed. No intravenous contrast was administered.  COMPARISON:  04/05/2020 CT abdomen pelvis.  FINDINGS: Segmentation:  Standard.  Alignment:  Normal.  Vertebrae: Normal bone marrow signal intensity. No focal osseous lesions. No bone marrow edema.  Conus medullaris and cauda equina: Conus extends to the L1 level. Conus and cauda equina appear normal. No evidence of hemorrhage within the thecal sac.  Disc levels: Disc spaces are preserved.  L1-2: Negative  L2-3: Negative  L3-4: Negative  L4-5: Negative  L5-S1: Negative  Paraspinal and other soft tissues: There is posterior body wall edema along the midline spanning the T12-L5 levels. No focal  fluid collection identified.  Redemonstration of moderate to severe right hydroureteronephrosis.  IMPRESSION: No evidence of hematoma or intrathecal hemorrhage. Midline posterior body wall edema.  No significant spinal canal or neural foraminal narrowing.  Grossly unchanged moderate to severe right hydroureteronephrosis.  These results were called by telephone at the time of interpretation on 04/11/2020 at 2:02 pm to provider [facility] , who verbally acknowledged these results.  Electronically Signed   By: [facility] M[facility]   On: 04/11/2020 14:05  A/P 1) right nephrolithiasis status post ureteral stent placement with persistent pain.  It may be unreasonable to expect the patient to be pain-free.  Discussed the goal of improved pain control on oral medications 2) fetal wellbeing reassuring 3) some pain and weakness may be related to deconditioning from being in the bed essentially since admission.  PT consultation may be beneficial 4) we will also have neurology evaluate the patient for any other possible attributions to pain and weakness

## 2020-04-12 NOTE — Op Note (Signed)
NAME: Jenny Barnes, Jenny Barnes MEDICAL RECORD [id number] ACCOUNT [id number] DATE OF BIRTH:04-25-1996 FACILITY: [facility] LOCATION: [facility] PHYSICIAN:Kyani Simkin Berneice Heinrich, MD  OPERATIVE REPORT  DATE OF PROCEDURE:  04/09/2020  SURGEON:  Sebastian Ache, MD  PREOPERATIVE DIAGNOSIS:  Right ureteral stone and renal stone and third trimester pregnancy with refractory colic.  PROCEDURE: 1.  Cystoscopy, right retrograde pyelogram, interpretation. 2.  On right ureteroscopy, basketing of stones. 3.  Insertion of right ureteral stent, 5 x 24 Polaris, no tether.  ESTIMATED BLOOD LOSS:  Nil.  COMPLICATIONS:  None.  SPECIMEN:  Right ureteral and renal stone fragments for composition analysis.  FINDINGS: 1.  Right distal ureteral stone with moderate hydronephrosis. 2.  Significant right proximal ureteral tortuosity. 3.  Right upper mid pole papillary tip calcification. 4.  Complete resolution of all accessible stone fragments larger than one-third mm following basket extraction. 5.  Successful placement of right ureteral stent, proximal end in renal pelvis, distal end in urinary bladder.  INDICATIONS:  The patient is a very pleasant 24 year old young woman who presented with third trimester pregnancy, who was found on workup of colicky flank pain and severe nausea and vomiting to have a right distal ureteral stone approximately 2 mm  several days ago.  She was appropriately placed on aggressive medical therapy with intravenous hydration and alpha blockers and straining of all urine.  Despite these maneuvers, she has failed to pass her stone.  Her colic has been quite severe, with  refractory pain as well as unable to tolerate minimal nutrition despite antinausea and antiemetics.  Given the relative risks of fetus and mother with the risks of her colic with poor nutrition, refractory symptoms versus the risk for definitive  management with a semirigid ureteroscopy, we elected a path of medical management  for several days with plan for ureteroscopy if failed to pass the stone.  She has failed to pass the stone and as planned, she will proceed with ureteroscopy today with  goal of stone free as long as intraoperative anatomy favorable.  She has undergone extensive evaluation by our obstetrics team.  She had a neutral nonstress test this morning.  She presents for surgery today.  Notably, staff from [facility],  including equipment reps, surgical techs, who finally agreed to be part of her procedure today in order to provide optimal care for the patient.  DESCRIPTION OF PROCEDURE:  The patient being identified, the procedure being right ureteroscopic stone manipulation was confirmed.  Procedure timeout was performed.  Intravenous antibiotics were administered.  Spinal anesthesia was initially introduced.   That was quite successful.  Her vagina, introitus and proximal thighs were prepped with iodine and cystourethroscopy was performed using 21-French rigid cystoscope with offset  lens.  Inspection unilateral no diverticula, calcifications or papillary  lesions.  There was significant squamous metaplasia as expected given her high estrogen state.  The right ureteral orifice was cannulated with a 6-French renal catheter and very gentle right retrograde pyelogram was obtained.  Right retrograde pyelogram with a very short spot fluoroscopic images revealed a single right ureter with single system right kidney with moderate hydronephrosis to the distal ureter, consistent with known stone.  There is no obvious filling defect noted  at this time.  A 0.038 ZIPwire was advanced to lower pole and set aside as a safety wire, an 14F feeding tube placed in the urinary bladder for pressure release, and semirigid ureteroscopy was performed of the distal right ureter alongside a separate sensor working  wire within the intramural  ureter.  The patient's pain became quite significant and unmanageable.  This was likely  above the level of her spinal.  In discussion with anesthesia colleagues, decision was made to continue, put under general anesthesia which  was introduced per endotracheal tube.  Having established adequate general anesthesia, semirigid ureteroscopy was then easily performed of the distal four-fifths of the right ureter alongside a separate sensor working wire.  The intramural ureter was  quite snug, but without any obvious focal strictures.  There were no stones noted within the distal ureter.  This was likely felt to represent either interval passage of stone versus retrograde positioning into the kidney.  As the goal today was to  render stone free, the semirigid scope was exchanged for a 12/14 short length ureteral access sheath to the level of the mid ureter using very short time interval fluoroscopic guidance and flexible digital ureteroscopy was performed of the proximal right  ureter and systematic inspection of the kidney.  The proximal ureter was somewhat tortuous, initially having nearly 360-degree curl.  However, with the ureteroscope very carefully and  gently passing this area with safety wire in place and the ureter  straightened, a more favorable geometry, all the calyces of the kidney was carefully inspected.  There was a dominant papillary tip calcification upper midpole as expected at approximately 3 mm.  There was a free floating stone in the lower pole,  likely representing retrograde positioning of prior distal ureteral stone.  These were both amenable to simple basketing.  As such, an Escape basket was used to grasp the stones sequentially in the long axis, and they were removed and set aside for  composition analysis.  Repeat ureteroscopy of the entire kidney revealed no additional calcifications.  The access sheath was removed under continuous vision.  No significant mucosal abnormalities were found.  Given her hydronephrosis and somewhat tight  distal ureter, it was felt that  interval stenting with a nontethered stent would be most prudent.  As such, a new 5 x 24 Polaris-type stent was placed with remaining safety wire using a combination of cystoscopic visualization and very short interval  fluoroscopic exam.  Good proximal and distal planes were noted.  The procedure was terminated.  The patient tolerated the procedure well.  No immediate perioperative complications.  The patient was taken to the postanesthesia care unit in stable  condition with plan for continued inpatient admission, likely discharge home tomorrow.  VN/NUANCE  D:04/09/2020 T:04/09/2020 JOB:[id number]

## 2020-04-12 NOTE — Consult Note (Addendum)
Neurology Consultation  Reason for Consult: Bilateral leg pain Referring Physician: Dr. Tenny Craw  CC: Back pain and quadricep pain  History is obtained from: Patient  HPI: Jenny Barnes is a 24 y.o. female with history of thyroid disease and currently 30 weeks and 3 days into pregnancy.  Patient was having flank pain and found to have kidney stones and recently had a stent placed.  Since that surgery she has not been very mobile.  Over the last 8 days since she has been in the hospital per nurse she has only gotten out of bed to go to the bathroom.  Patient's main complaint at this time is back discomfort and pain, pain in the region of her SI joint, pain that is on the sides of her legs and at times on the top of her thighs.  It is not constant but comes and goes.  It is clear when she rolls or moves in the bed she is very stiff.  She currently is laying on multiple heating pads.  She states that this allows her to get some relief in between her pain medications.  She currently is on 2 mg Dilaudid every 3 hours to which she is getting every 3 hours.  She does admit that she has not been getting up other than to go to the bathroom.  Neurology was asked see patient and give any further recommendations if we have any.   Past Medical History:  Diagnosis Date  . Hypothyroidism   . Thyroid disease    Family History  Problem Relation Age of Onset  . Kidney Stones Mother   . Kidney Stones Sister    Social History:   reports that she has never smoked. She has never used smokeless tobacco. She reports that she does not drink alcohol and does not use drugs.  Medications  Current Facility-Administered Medications:  .  0.9 %  sodium chloride infusion, , Intravenous, Continuous, Sebastian Ache, MD, Last Rate: 50 mL/hr at 04/08/20 1743, New Bag at 04/08/20 1743 .  acetaminophen (TYLENOL) tablet 650 mg, 650 mg, Oral, Q4H PRN, Sebastian Ache, MD, 650 mg at 04/10/20 1735 .  calcium carbonate  (TUMS - dosed in mg elemental calcium) chewable tablet 400 mg of elemental calcium, 2 tablet, Oral, Q4H PRN, Sebastian Ache, MD .  cephALEXin (KEFLEX) capsule 500 mg, 500 mg, Oral, Q12H, Sebastian Ache, MD, 500 mg at 04/12/20 0952 .  diphenhydrAMINE (BENADRYL) injection 12.5 mg, 12.5 mg, Intravenous, Q6H PRN **OR** diphenhydrAMINE (BENADRYL) 12.5 MG/5ML elixir 12.5 mg, 12.5 mg, Oral, Q6H PRN, Levi Aland, MD .  docusate sodium (COLACE) capsule 100 mg, 100 mg, Oral, Daily, Sebastian Ache, MD, 100 mg at 04/12/20 0953 .  HYDROmorphone (DILAUDID) tablet 2 mg, 2 mg, Oral, Q3H PRN, Levi Aland, MD, 2 mg at 04/12/20 1254 .  lactated ringers infusion, , Intravenous, Continuous, Sebastian Ache, MD, Last Rate: 75 mL/hr at 04/12/20 0502, New Bag at 04/12/20 0502 .  levothyroxine (SYNTHROID) tablet 200 mcg, 200 mcg, Oral, Q0600, Sebastian Ache, MD, 200 mcg at 04/12/20 0613 .  naloxone [facility]) injection 0.4 mg, 0.4 mg, Intravenous, PRN **AND** sodium chloride flush (NS) 0.9 % injection 9 mL, 9 mL, Intravenous, PRN, Levi Aland, MD .  ondansetron [facility]) injection 4 mg, 4 mg, Intravenous, Q6H PRN, Levi Aland, MD, 4 mg at 04/10/20 0244 .  ondansetron (ZOFRAN-ODT) disintegrating tablet 4 mg, 4 mg, Oral, Q8H PRN, Sebastian Ache, MD, 4 mg at 04/10/20 1605 .  opium-belladonna (  B&O) suppository 16.2-60mg , 1 suppository, Rectal, Once, Sebastian Ache, MD .  prenatal multivitamin tablet 1 tablet, 1 tablet, Oral, Q1200, Sebastian Ache, MD, 1 tablet at 04/12/20 1148 .  promethazine (PHENERGAN) injection 12.5-25 mg, 12.5-25 mg, Intravenous, Q6H PRN, Sebastian Ache, MD, 25 mg at 04/12/20 1209 .  tamsulosin (FLOMAX) capsule 0.4 mg, 0.4 mg, Oral, Daily, Sebastian Ache, MD, 0.4 mg at 04/12/20 0175 .  zolpidem (AMBIEN) tablet 5 mg, 5 mg, Oral, QHS PRN, Sebastian Ache, MD, 5 mg at 04/11/20 2332  ROS:    General ROS: negative for - chills, fatigue, fever, night sweats, weight gain or weight  loss Psychological ROS: negative for - behavioral disorder, hallucinations, memory difficulties, mood swings or suicidal ideation Ophthalmic ROS: negative for - blurry vision, double vision, eye pain or loss of vision ENT ROS: negative for - epistaxis, nasal discharge, oral lesions, sore throat, tinnitus or vertigo Allergy and Immunology ROS: negative for - hives or itchy/watery eyes Hematological and Lymphatic ROS: negative for - bleeding problems, bruising or swollen lymph nodes Endocrine ROS: negative for - galactorrhea, hair pattern changes, polydipsia/polyuria or temperature intolerance Respiratory ROS: negative for - cough, hemoptysis, shortness of breath or wheezing Cardiovascular ROS: negative for - chest pain, dyspnea on exertion, edema or irregular heartbeat Gastrointestinal ROS: negative for - abdominal pain, diarrhea, hematemesis, nausea/vomiting or stool incontinence Genito-Urinary ROS: negative for - dysuria, hematuria, incontinence or urinary frequency/urgency Musculoskeletal ROS: negative for - j muscular pain Neurological ROS: as noted in HPI Dermatological ROS: negative for rash and skin lesion changes  Exam: Current vital signs: BP (!) 102/58 (BP Location: Left Arm)   Pulse 96   Temp 98.8 F (37.1 C) (Oral)   Resp 18   Ht 5\' 4"  (1.626 m)   Wt 60.6 kg   SpO2 95%   BMI 22.92 kg/m  Vital signs in last 24 hours: Temp:  [97.7 F (36.5 C)-99.1 F (37.3 C)] 98.8 F (37.1 C) (08/16 1208) Pulse Rate:  [91-109] 96 (08/16 1208) Resp:  [16-18] 18 (08/16 1208) BP: (97-110)/(46-65) 102/58 (08/16 1208) SpO2:  [95 %-98 %] 95 % (08/16 1208)   Constitutional: Appears well-developed and well-nourished.  Psych: Affect appropriate to situation Eyes: No scleral injection HENT: No OP obstrucion Head: Normocephalic.  Cardiovascular: Normal rate and regular rhythm.  Respiratory: Effort normal, non-labored breathing GI: Soft.  No distension. There is no tenderness.  Skin:  WDI  Neuro: Mental Status: Patient is awake, alert, oriented to person, place, month, year, and situation.  Speech is clear with no dysarthria or aphasia.  She has intact naming, repeating, comprehension.  Patient is able to give a clear and coherent history.  Patient is able to follow commands without difficulty. Cranial Nerves: II: Visual Fields are full.  III,IV, VI: EOMI without ptosis or diploplia. Pupils equal, round and reactive to light V: Facial sensation is symmetric to temperature VII: Facial movement is symmetric.  VIII: hearing is intact to voice X: Palat elevates symmetrically XI: Shoulder shrug is symmetric. XII: tongue is midline without atrophy or fasciculations.  Motor: Tone is normal. Bulk is normal. 5/5 strength was present in all four extremities.  Sensory: Sensation is symmetric to light touch and temperature in the arms and legs.  No specific sensory deficits. Deep Tendon Reflexes: 2+ and symmetric in the biceps and patellae.  Plantars: Toes are downgoing bilaterally.  Cerebellar: FNF and HKS are intact bilaterally   Labs I have reviewed labs in epic and the results pertinent to this consultation are:  CBC    Component Value Date/Time   WBC 8.7 04/06/2020 0557   RBC 3.20 (L) 04/06/2020 0557   HGB 9.7 (L) 04/06/2020 0557   HCT 29.4 (L) 04/06/2020 0557   PLT 142 (L) 04/06/2020 0557   MCV 91.9 04/06/2020 0557   MCH 30.3 04/06/2020 0557   MCHC 33.0 04/06/2020 0557   RDW 12.6 04/06/2020 0557   LYMPHSABS 1.6 04/05/2020 1914   MONOABS 0.7 04/05/2020 1914   EOSABS 0.2 04/05/2020 1914   BASOSABS 0.0 04/05/2020 1914    CMP     Component Value Date/Time   NA 131 (L) 04/06/2020 0557   K 3.5 04/06/2020 0557   CL 102 04/06/2020 0557   CO2 23 04/06/2020 0557   GLUCOSE 82 04/06/2020 0557   BUN 9 04/06/2020 0557   CREATININE 1.09 (H) 04/06/2020 0557   CALCIUM 8.1 (L) 04/06/2020 0557   PROT 5.4 (L) 04/06/2020 0557   ALBUMIN 2.5 (L) 04/06/2020 0557    AST 13 (L) 04/06/2020 0557   ALT 9 04/06/2020 0557   ALKPHOS 39 04/06/2020 0557   BILITOT 0.6 04/06/2020 0557   GFRNONAA >60 04/06/2020 0557   GFRAA >60 04/06/2020 0557   Imaging I have reviewed the images obtained  MRI of lumbar spine shows no evidence of hematoma or intrathecal hemorrhage.  Midline posterior body wall edema.  No significant spinal canal or neural foraminal narrowing.  Jenny Morn PA-C [facility] [phone]  M-F  (9:00 am- 5:00 PM)  04/12/2020, 3:02 PM   I have seen the patient reviewed the above note.  When I asked the patient to demonstrate where she experiences numbness/pain when standing up, she is able to fairly accurately indicate the area of the lateral femoral cutaneous nerve.  On exam, she does not have any fixed deficit currently.  Assessment:  This is a 24 year old female who is [redacted] weeks pregnant now suffering from 8 days of back pain and anterior thigh/lateral thigh pain which is intermittent.  Given patient's history and exam I do believe that this is more of a musculoskeletal pain from being immobile.  We do have reassurance from MRI of lumbar spine there is no evidence of epidural hematoma or other structural cause.   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  This can be treated with gabapentin or Lyrica, though really supportive treatment will be the mainstay.  Recommendations: Could consider gabapentin Supportive care No further neurodiagnostics at this time.  Please call with any further questions or concerns.   Jenny Slot, MD [facility] [phone]  If 7pm- 7am, please page neurology on call as listed in AMION.

## 2020-04-12 NOTE — Progress Notes (Signed)
3 Days Post-Op   Subjective/Chief Complaint:   1 - Rt Ureteral / Renal Stones with Hydronephrosis -  S/p RIGHT ureteroscopy with basketing stones 04/09/20 to stone free for 22mm Rt UVJ (intramural ureter) and 27mm Rt mid papillary tip stones. Rt JJ stent placed as significant mucosal edema in area of prior stone.   2 - Stent Colic - pt with improved but persistant flank pain after stone removal, likely due to stent colic. MRI 8/15 with stent in good position and no perinephric hematomas. Some residual hydro (expected). This is improving with time.   Today " Jenny Barnes " is stable. She has improved but persistant flank pain, now from stent colic. It is improving day by day. No interval fevers.    Objective: Vital signs in last 24 hours: Temp:  [97.7 F (36.5 C)-98.9 F (37.2 C)] 98.2 F (36.8 C) (08/16 1900) Pulse Rate:  [91-101] 95 (08/16 1900) Resp:  [18] 18 (08/16 1900) BP: (97-110)/(46-66) 107/66 (08/16 1900) SpO2:  [95 %-97 %] 95 % (08/16 1900) Last BM Date: 04/05/20  Intake/Output from previous day: 08/15 0701 - 08/16 0700 In: 125 [I.V.:125] Out: 2550 [Urine:2550] Intake/Output this shift: Total I/O In: 240 [P.O.:240] Out: 850 [Urine:850]  General appearance: alert, cooperative and much less malaise and greatly improved mood v. pre-op. Husband at bedside.  Eyes: negative Nose: Nares normal. Septum midline. Mucosa normal. No drainage or sinus tenderness. Throat: lips, mucosa, and tongue normal; teeth and gums normal Neck: supple, symmetrical, trachea midline Back: Mild Rt CVAT Resp: Non-labored on room air.  Cardio: sinus in 80s GI: normal findings: aorta normal Extremities: extremities normal, atraumatic, no cyanosis or edema Skin: Skin color, texture, turgor normal. No rashes or lesions Lymph nodes: Cervical, supraclavicular, and axillary nodes normal. Neurologic: Grossly normal  Stable gravid uterus  Lab Results:  No results for input(s): WBC, HGB, HCT, PLT in the  last 72 hours. BMET No results for input(s): NA, K, CL, CO2, GLUCOSE, BUN, CREATININE, CALCIUM in the last 72 hours. PT/INR No results for input(s): LABPROT, INR in the last 72 hours. ABG No results for input(s): PHART, HCO3 in the last 72 hours.  Invalid input(s): PCO2, PO2  Studies/Results: MR LUMBAR SPINE WO CONTRAST  Result Date: 04/11/2020 CLINICAL DATA:  Lower extremity weakness and sensory disturbance, rule out spinal hematoma. EXAM: MRI LUMBAR SPINE WITHOUT CONTRAST TECHNIQUE: Multiplanar, multisequence MR imaging of the lumbar spine was performed. No intravenous contrast was administered. COMPARISON:  04/05/2020 CT abdomen pelvis. FINDINGS: Segmentation:  Standard. Alignment:  Normal. Vertebrae: Normal bone marrow signal intensity. No focal osseous lesions. No bone marrow edema. Conus medullaris and cauda equina: Conus extends to the L1 level. Conus and cauda equina appear normal. No evidence of hemorrhage within the thecal sac. Disc levels: Disc spaces are preserved. L1-2: Negative L2-3: Negative L3-4: Negative L4-5: Negative L5-S1: Negative Paraspinal and other soft tissues: There is posterior body wall edema along the midline spanning the T12-L5 levels. No focal fluid collection identified. Redemonstration of moderate to severe right hydroureteronephrosis. IMPRESSION: No evidence of hematoma or intrathecal hemorrhage. Midline posterior body wall edema. No significant spinal canal or neural foraminal narrowing. Grossly unchanged moderate to severe right hydroureteronephrosis. These results were called by telephone at the time of interpretation on 04/11/2020 at 2:02 pm to provider [facility] , who verbally acknowledged these results. Electronically Signed   By: Stana Bunting M.D.   On: 04/11/2020 14:05    Anti-infectives: Anti-infectives (From admission, onward)   Start  Dose/Rate Route Frequency Ordered Stop   04/09/20 1440  ceFAZolin (ANCEF) 2-4 GM/100ML-% IVPB       Note to  Pharmacy: Launa Flight   : cabinet override      04/09/20 1440 04/09/20 1540   04/09/20 1437  ceFAZolin (ANCEF) IVPB 2g/100 mL premix        2 g 200 mL/hr over 30 Minutes Intravenous 30 min pre-op 04/09/20 1437 04/09/20 1517   04/06/20 2200  cephALEXin (KEFLEX) capsule 500 mg     Discontinue     500 mg Oral Every 12 hours 04/06/20 2005        Assessment/Plan:  1 - Rt Ureteral / Renal Stones with Hydronephrosis -  Now stone free. She understands importance of future renal imaging BEFORE elective pregnancies to verify stone free prior. DC Keflex.   2 - Stent Colic - frustrating and very variable problem. As her symptoms are more flank (not bladder), favor continued judicious narcotic pain meds as best therapy. Also begin scheduled tylenol 1000mg  PO Q8 as safe adjunct. This is already improveing. STRONGLY favor stent stay in place at least 1-2 weeks post-op given her amount of ureteral edema noted at surgery. We discussed possible removal before then, but that there would be risk of obstruction again.   At this point she is amenable to continued pain meds with plan to titrate back and get home.   Again, GREATLY appreciate OB, anesthesia, neurology team comanagement.   Please call me directly with questions anytime.   04/12/2020

## 2020-04-13 MED ORDER — POLYETHYLENE GLYCOL 3350 17 G PO PACK
17.0000 g | PACK | Freq: Every day | ORAL | Status: DC | PRN
  Administered 2020-04-13: 17 g via ORAL
  Filled 2020-04-13: qty 1

## 2020-04-13 MED ORDER — SODIUM CHLORIDE 0.9 % IV SOLN
8.0000 mg | Freq: Three times a day (TID) | INTRAVENOUS | Status: DC | PRN
  Administered 2020-04-13: 8 mg via INTRAVENOUS
  Filled 2020-04-13 (×2): qty 4

## 2020-04-13 NOTE — Progress Notes (Addendum)
24 y.o. G2P1001 [redacted]w[redacted]d HD#8 admitted for Nephrolithiasis [N20.0] Ureteral stone with hydronephrosis [N13.2].  Pt currently stable but still with flank pain yesterday- now much better.  Pt is now having difficulty with nausea.  Good FM.  Vitals:   04/12/20 1605 04/12/20 1900 04/12/20 2301 04/13/20 0400  BP: 108/63 107/66 103/60 110/63  Pulse: 96 95 85 88  Resp: 18 18 18 18   Temp: 98.6 F (37 C) 98.2 F (36.8 C) 98.6 F (37 C) 97.9 F (36.6 C)  TempSrc: Oral Oral Oral Oral  SpO2: 95% 95% 96% 98%  Weight:      Height:        Lungs CTA Cor RRR Abd  Soft, gravid, nontender Ex SCDs FHTs  Last night 140s, good short term variability, NST R Toco  occ  No results found for this or any previous visit (from the past 24 hour(s)).  A:  HD#8  [redacted]w[redacted]d with nephrolithiasis and flank pain post stent placement.  P: 1.  Appreciate Urology's help.  Stent in place for now. 2.  Pt has not been mobile during this stay secondary pain.  With stent in place now, PT ordered to start working with pt to get mobility and musculoskeletal pain under control.  Neurology consulted for lower extremity weaknes. 3.  NST s all stable and reactive. 4.  Zofran IV for nausea. [redacted]w[redacted]d

## 2020-04-13 NOTE — Progress Notes (Signed)
PT Cancellation Note  Patient Details Name: Jenny Barnes MRN: [id number] DOB: Aug 04, 1996   Cancelled Treatment:    Reason Eval/Treat Not Completed: PT screened, no needs identified, will sign off (patient reports she feels much better and has been ambulating in the room and hallway; deferred PT evaluation.) PT will sign off.  Please reconsult if any changes in status.  Thank you,  Olivia Canter 04/13/2020, 11:18 AM

## 2020-04-13 NOTE — Progress Notes (Signed)
4 Days Post-Op   Subjective/Chief Complaint:   1 - Rt Ureteral / Renal Stones with Hydronephrosis -  S/p RIGHT ureteroscopy with basketing stones 04/09/20 to stone free for 23mm Rt UVJ (intramural ureter) and 34mm Rt mid papillary tip stones. Rt JJ stent placed as significant mucosal edema in area of prior stone.   2 - Stent Colic - pt with improved but persistant flank pain after stone removal, likely due to stent colic. MRI 8/15 with stent in good position and no perinephric hematomas. Some residual hydro (expected). This is improving with time.   Today " Jenny Barnes " is finally feeling improved. Much less narcotic requironment on scheduled tylenol. Nearly completely resolved leg pain and back pain.    Objective: Vital signs in last 24 hours: Temp:  [97.7 F (36.5 C)-98.9 F (37.2 C)] 98.9 F (37.2 C) (08/17 1932) Pulse Rate:  [77-95] 86 (08/17 1932) Resp:  [16-18] 18 (08/17 1932) BP: (99-110)/(54-63) 103/57 (08/17 1932) SpO2:  [96 %-100 %] 100 % (08/17 1932) Last BM Date: 04/05/20  Intake/Output from previous day: 08/16 0701 - 08/17 0700 In: 2176.8 [P.O.:480; I.V.:1696.8] Out: 2950 [Urine:2950] Intake/Output this shift: No intake/output data recorded.   General appearance: IN much better spirits today.  Eyes: negative Nose: Nares normal. Septum midline. Mucosa normal. No drainage or sinus tenderness. Throat: lips, mucosa, and tongue normal; teeth and gums normal Neck: supple, symmetrical, trachea midline Back: NO CVAT at present.  Resp: Non-labored on room air.  Cardio: reg rate GI: normal findings: aorta normal Extremities: extremities normal, atraumatic, no cyanosis or edema Skin: Skin color, texture, turgor normal. No rashes or lesions Lymph nodes: Cervical, supraclavicular, and axillary nodes normal. Neurologic: Grossly normal  Stable gravid uterus with fetal movement  Lab Results:  No results for input(s): WBC, HGB, HCT, PLT in the last 72 hours. BMET No results for  input(s): NA, K, CL, CO2, GLUCOSE, BUN, CREATININE, CALCIUM in the last 72 hours. PT/INR No results for input(s): LABPROT, INR in the last 72 hours. ABG No results for input(s): PHART, HCO3 in the last 72 hours.  Invalid input(s): PCO2, PO2  Studies/Results: No results found.  Anti-infectives: Anti-infectives (From admission, onward)   Start     Dose/Rate Route Frequency Ordered Stop   04/09/20 1440  ceFAZolin (ANCEF) 2-4 GM/100ML-% IVPB       Note to Pharmacy: Launa Flight   : cabinet override      04/09/20 1440 04/09/20 1540   04/09/20 1437  ceFAZolin (ANCEF) IVPB 2g/100 mL premix        2 g 200 mL/hr over 30 Minutes Intravenous 30 min pre-op 04/09/20 1437 04/09/20 1517   04/06/20 2200  cephALEXin (KEFLEX) capsule 500 mg  Status:  Discontinued        500 mg Oral Every 12 hours 04/06/20 2005 04/12/20 2043      Assessment/Plan:  1 - Rt Ureteral / Renal Stones with Hydronephrosis -  Now stone free. She understands importance of future renal imaging BEFORE elective pregnancies to verify stone free prior.   2 - Stent Colic - this is improving quickly, especially on the scheduled tylenol. She is very happy at present level and feel could likely tolerate at home. She is certainly OK for DC home at anypoint from [employer]. She is amenable to keeping current stent as planned with office removal in 1-2 weeks that is already arranged.   Again, GREATLY appreciate OB, anesthesia, neurology team comanagement.   Please call me directly with questions  anytime.   Sebastian Ache 04/13/2020

## 2020-04-13 NOTE — Progress Notes (Signed)
Initial Nutrition Assessment  DOCUMENTATION CODES:   Not applicable  INTERVENTION:  Regular diet Pt may order snacks TID and double protein portions at meals if she makes request when ordering meals   NUTRITION DIAGNOSIS:   Increased nutrient needs related to  (pregnancy and fetal growth requirements) as evidenced by  (30 weeks IUP).   GOAL:   Patient will meet greater than or equal to 90% of their needs   MONITOR:   Weight trends  REASON FOR ASSESSMENT:   Antenatal, LOS    ASSESSMENT:   Now 30 4/7 weeks,admitted for Nephrolithiasis. Continues with adm for pain control. Weight 10/14/19 54 Kg (119 Lbs) weight gain of 14 lbs to date   Diet Order:   Diet Order            Diet regular Room service appropriate? Yes; Fluid consistency: Thin  Diet effective now                 EDUCATION NEEDS:   No education needs have been identified at this time  Skin:  Skin Assessment: Reviewed RN Assessment   Height:   Ht Readings from Last 1 Encounters:  04/05/20 5\' 4"  (1.626 m)    Weight:   Wt Readings from Last 1 Encounters:  04/05/20 60.6 kg    Ideal Body Weight:   120 lbs  BMI:  Body mass index is 22.92 kg/m.  Estimated Nutritional Needs:   Kcal:  1800-2000  Protein:  80-90 g  Fluid:  2.1 L    06/05/20 M.M LDN Neonatal Nutrition Support Specialist/RD III

## 2020-04-14 MED ORDER — ACETAMINOPHEN 500 MG PO TABS: 1000.0000 mg | ORAL_TABLET | Freq: Three times a day (TID) | ORAL | 0 refills | Status: AC

## 2020-04-14 MED ORDER — PROMETHAZINE HCL 25 MG PO TABS: 25.0000 mg | ORAL_TABLET | Freq: Four times a day (QID) | ORAL | 0 refills | Status: AC | PRN

## 2020-04-14 MED ORDER — HYDROMORPHONE HCL 2 MG PO TABS: 2.0000 mg | ORAL_TABLET | ORAL | 0 refills | Status: DC | PRN

## 2020-04-14 NOTE — Discharge Summary (Signed)
Physician Discharge Summary  Patient ID: Jenny Barnes MRN: [id number] DOB/AGE: Jun 10, 1996 24 y.o.  Admit date: 04/05/2020 Discharge date: 04/14/2020  Admission Diagnoses: nephrolithiasis  Discharge Diagnoses:  Active Problems:   Nephrolithiasis   Ureteral stone with hydronephrosis   Discharged Condition: good  Hospital Course: Pt was admitted for pain, imaging showed R hydronephrosis and 16mm stone at UVJ.  Nephrology was consulted, pt was placed on flomax, Keflex and pain control.  After several days, urology moved to stent pt, confirmed proper placement my MRI, and thereafter stone passed.  Pt remained for additional pain management and PT.  On HD#9, with pain now controlled on tylenol, pt was discharged home.  She has f/u appt with urology in 2 weeks and will be seen in our ob/gyn office in 1 week.  Urology recommended continued scheduled tylenol at home with Dilaudid as needed for breakthrough pain.   Also phenergan as needed for nausea.  Rxs sent.  Consults: urology  Significant Diagnostic Studies: CT, fluoroscopy, MRI  Treatments: procedures: ureteral stent  Discharge Exam: Blood pressure (!) 102/59, pulse 80, temperature 98.2 F (36.8 C), temperature source Oral, resp. rate 16, height 5\' 4"  (1.626 m), weight 60.6 kg, SpO2 98 %, unknown if currently breastfeeding. General: alert, no distress Abd: nontender  Disposition:  There are no questions and answers to display.         Allergies as of 04/14/2020   No Known Allergies     Medication List    STOP taking these medications   cyclobenzaprine 5 MG tablet Commonly known as: FLEXERIL     TAKE these medications   acetaminophen 500 MG tablet Commonly known as: TYLENOL Take 2 tablets (1,000 mg total) by mouth every 8 (eight) hours. What changed:   when to take this  reasons to take this   HYDROmorphone 2 MG tablet Commonly known as: DILAUDID Take 1 tablet (2 mg total) by mouth every 3 (three) hours  as needed for severe pain.   levothyroxine 200 MCG tablet Commonly known as: SYNTHROID Take 200 mcg by mouth daily before breakfast.   prenatal multivitamin Tabs tablet Take 1 tablet by mouth daily at 12 noon.   promethazine 25 MG tablet Commonly known as: PHENERGAN Take 1 tablet (25 mg total) by mouth every 6 (six) hours as needed for nausea or vomiting.       Follow-up Information    Ob/Gyn, 04/16/2020 Follow up in 1 week(s).   Contact information: [address] [place] [address] [place] [phone]               Signed: [phone] 04/14/2020, 9:14 AM

## 2020-04-14 NOTE — Plan of Care (Signed)
  Problem: Elimination: Goal: Will not experience complications related to bowel motility Outcome: Adequate for Discharge Goal: Will not experience complications related to urinary retention Outcome: Adequate for Discharge   

## 2020-04-15 ENCOUNTER — Telehealth: Payer: Self-pay | Admitting: *Deleted

## 2020-04-15 NOTE — Telephone Encounter (Signed)
Medicaid Managed Care team Transition of Care Assessment outreach attempt #1 made today. Unable to reach patient. HIPPA compliant voice message left requesting a return call. The patient has also been enrolled in an automated discharge follow up call series and will receive two outreach attempts for transition of care assessment. Contact information has been left for the patient and the Medicaid Managed Care team is available to provide assistance to the patient at any time.  ° °Katrice Joesph Marcy, RN, BSN, CCRN °Patient Engagement Center °[phone] ° °

## 2020-04-25 ENCOUNTER — Encounter (HOSPITAL_COMMUNITY): Payer: Self-pay | Admitting: Obstetrics and Gynecology

## 2020-04-25 ENCOUNTER — Other Ambulatory Visit: Payer: Self-pay

## 2020-04-25 ENCOUNTER — Inpatient Hospital Stay (HOSPITAL_COMMUNITY): Payer: Medicaid Other

## 2020-04-25 ENCOUNTER — Inpatient Hospital Stay (HOSPITAL_COMMUNITY)
Admission: AD | Admit: 2020-04-25 | Discharge: 2020-04-26 | Disposition: A | Discharge status: Home / Self Care | Payer: Medicaid Other | Attending: Obstetrics and Gynecology | Admitting: Obstetrics and Gynecology

## 2020-04-25 DIAGNOSIS — Z87442 Personal history of urinary calculi: Secondary | ICD-10-CM | POA: Diagnosis not present

## 2020-04-25 DIAGNOSIS — O212 Late vomiting of pregnancy: Secondary | ICD-10-CM | POA: Insufficient documentation

## 2020-04-25 DIAGNOSIS — Z3A32 32 weeks gestation of pregnancy: Secondary | ICD-10-CM | POA: Diagnosis not present

## 2020-04-25 DIAGNOSIS — Z79899 Other long term (current) drug therapy: Secondary | ICD-10-CM | POA: Diagnosis not present

## 2020-04-25 DIAGNOSIS — R319 Hematuria, unspecified: Secondary | ICD-10-CM | POA: Diagnosis not present

## 2020-04-25 DIAGNOSIS — O4693 Antepartum hemorrhage, unspecified, third trimester: Secondary | ICD-10-CM | POA: Diagnosis not present

## 2020-04-25 DIAGNOSIS — Z79890 Hormone replacement therapy (postmenopausal): Secondary | ICD-10-CM | POA: Insufficient documentation

## 2020-04-25 DIAGNOSIS — R300 Dysuria: Secondary | ICD-10-CM

## 2020-04-25 DIAGNOSIS — O99283 Endocrine, nutritional and metabolic diseases complicating pregnancy, third trimester: Secondary | ICD-10-CM | POA: Diagnosis not present

## 2020-04-25 DIAGNOSIS — R109 Unspecified abdominal pain: Secondary | ICD-10-CM

## 2020-04-25 DIAGNOSIS — E079 Disorder of thyroid, unspecified: Secondary | ICD-10-CM | POA: Insufficient documentation

## 2020-04-25 DIAGNOSIS — O26893 Other specified pregnancy related conditions, third trimester: Secondary | ICD-10-CM | POA: Insufficient documentation

## 2020-04-25 DIAGNOSIS — Z3689 Encounter for other specified antenatal screening: Secondary | ICD-10-CM

## 2020-04-25 DIAGNOSIS — R112 Nausea with vomiting, unspecified: Secondary | ICD-10-CM

## 2020-04-25 DIAGNOSIS — R310 Gross hematuria: Secondary | ICD-10-CM | POA: Diagnosis not present

## 2020-04-25 DIAGNOSIS — O99891 Other specified diseases and conditions complicating pregnancy: Secondary | ICD-10-CM | POA: Diagnosis not present

## 2020-04-25 LAB — COMPREHENSIVE METABOLIC PANEL
ALT: 10 U/L (ref 0–44)
AST: 11 U/L — ABNORMAL LOW (ref 15–41)
Albumin: 3.1 g/dL — ABNORMAL LOW (ref 3.5–5.0)
Alkaline Phosphatase: 53 U/L (ref 38–126)
Anion gap: 8 (ref 5–15)
BUN: 10 mg/dL (ref 6–20)
CO2: 24 mmol/L (ref 22–32)
Calcium: 9.1 mg/dL (ref 8.9–10.3)
Chloride: 105 mmol/L (ref 98–111)
Creatinine, Ser: 0.63 mg/dL (ref 0.44–1.00)
GFR calc Af Amer: >60 mL/min (ref >60)
GFR calc non Af Amer: >60 mL/min (ref >60)
Glucose, Bld: 80 mg/dL (ref 70–99)
Potassium: 3.4 mmol/L — ABNORMAL LOW (ref 3.5–5.1)
Sodium: 137 mmol/L (ref 135–145)
Total Bilirubin: 0.2 mg/dL — ABNORMAL LOW (ref 0.3–1.2)
Total Protein: 6.4 g/dL — ABNORMAL LOW (ref 6.5–8.1)

## 2020-04-25 LAB — URINALYSIS, ROUTINE W REFLEX MICROSCOPIC
Bacteria, UA: NONE SEEN
Bilirubin Urine: NEGATIVE
Glucose, UA: NEGATIVE mg/dL
Ketones, ur: NEGATIVE mg/dL
Nitrite: NEGATIVE
Protein, ur: 100 mg/dL — AB
RBC / HPF: >50 RBC/hpf — ABNORMAL HIGH (ref 0–5)
Specific Gravity, Urine: 1.015 (ref 1.005–1.030)
pH: 6 (ref 5.0–8.0)

## 2020-04-25 LAB — CBC WITH DIFFERENTIAL/PLATELET
Abs Immature Granulocytes: 0.1 10*3/uL — ABNORMAL HIGH (ref 0.00–0.07)
Basophils Absolute: 0 10*3/uL (ref 0.0–0.1)
Basophils Relative: 0 %
Eosinophils Absolute: 0.2 10*3/uL (ref 0.0–0.5)
Eosinophils Relative: 3 %
HCT: 29.9 % — ABNORMAL LOW (ref 36.0–46.0)
Hemoglobin: 9.9 g/dL — ABNORMAL LOW (ref 12.0–15.0)
Immature Granulocytes: 1 %
Lymphocytes Relative: 23 %
Lymphs Abs: 1.8 10*3/uL (ref 0.7–4.0)
MCH: 30.3 pg (ref 26.0–34.0)
MCHC: 33.1 g/dL (ref 30.0–36.0)
MCV: 91.4 fL (ref 80.0–100.0)
Monocytes Absolute: 0.7 10*3/uL (ref 0.1–1.0)
Monocytes Relative: 8 %
Neutro Abs: 5.2 10*3/uL (ref 1.7–7.7)
Neutrophils Relative %: 65 %
Platelets: 224 10*3/uL (ref 150–400)
RBC: 3.27 MIL/uL — ABNORMAL LOW (ref 3.87–5.11)
RDW: 13.2 % (ref 11.5–15.5)
WBC: 8 10*3/uL (ref 4.0–10.5)
nRBC: 0 % (ref 0.0–0.2)

## 2020-04-25 MED ORDER — HYDROMORPHONE HCL 1 MG/ML IJ SOLN
1.0000 mg | Freq: Once | INTRAMUSCULAR | Status: AC
  Administered 2020-04-25: 1 mg via INTRAMUSCULAR
  Filled 2020-04-25: qty 1

## 2020-04-25 MED ORDER — ONDANSETRON 4 MG PO TBDP
8.0000 mg | ORAL_TABLET | Freq: Once | ORAL | Status: AC
  Administered 2020-04-25: 8 mg via ORAL
  Filled 2020-04-25: qty 2

## 2020-04-25 NOTE — MAU Provider Note (Signed)
History     CSN: [id number]  Arrival date and time: 04/25/20 2137   First Provider Initiated Contact with Patient 04/25/20 2217      Chief Complaint  Patient presents with  . Abdominal Pain   [name] is a 24 y.o. G2P1001 at [redacted]w[redacted]d who presents to MAU for blood in urine and pain with urination along with back pain stretching in to abdomen as well. Patient reports she was admitted to hospital on 04/05/2020 for partially obstructing kidney stone and was seen by nephrology after Flomax was unsuccessful x3 days and her symptoms were worsening. Patient reports she had a stent inserted and two stones removed. After the stones were removed, the patient reports she had continued problems with pain management and was kept in the hospital for several more days.  Patient reports on Thursday this week, pt started experiencing on both flanks, which progressed to wrapping around her upper abdomen. Patient describes pain as a squeezing pain and twisting pain and cramping, but denies feeling like a contractions. Patient reports on Thursday, she was urinating clots of blood, and on Friday she started experiencing frank bleeding with every episode of urination, which she reports is getting worse. Patient reports she was told to experience some bleeding with urination for a few days following stent placement, which she said she did experience, though not with every act of urination, but that the bleeding went away. A few days later and then returned while at home. Patient denies seeing bleeding in her underwear or having bleeding between episodes of urination. Patient rates pain as 7.5/10. Patient also reports N/V that started again today - pt reports vomiting x3 today.  Patient reports she had Gatorade and water about 1 hour ago. Patient reports she has not eaten since 5PM.  Pt denies VB, LOF, ctx, decreased FM, vaginal discharge/odor/itching. Pt denies N/V, constipation, diarrhea, or urinary  problems. Pt denies fever, chills, fatigue, sweating or changes in appetite. Pt denies SOB or chest pain. Pt denies dizziness, HA, light-headedness, weakness.  Problems this pregnancy include: kidney stone with stent placement, hypothyroid. Allergies? NKDA Current medications/supplements? Dilaudid, Phenergan, Tylenol around the clock, Synthroid, PNV Prenatal care provider? [facility] [place], next appt 04/30/2020   OB History    Gravida  2   Para  1   Term  1   Preterm      AB      Living  1     SAB      TAB      Ectopic      Multiple  0   Live Births  1           Past Medical History:  Diagnosis Date  . Hypothyroidism   . Thyroid disease     Past Surgical History:  Procedure Laterality Date  . CYSTOSCOPY WITH RETROGRADE PYELOGRAM, URETEROSCOPY AND STENT PLACEMENT Right 04/09/2020   Procedure: URETEROSCOPY, BASKETING OF STONE & DOUBLE J. STENT PLACEMENT;  Surgeon: Sebastian Ache, MD;  Location: [facility] OR;  Service: Urology;  Laterality: Right;  . WISDOM TOOTH EXTRACTION      Family History  Problem Relation Age of Onset  . Kidney Stones Mother   . Kidney Stones Sister     Social History   Tobacco Use  . Smoking status: Never Smoker  . Smokeless tobacco: Never Used  Vaping Use  . Vaping Use: Never used  Substance Use Topics  . Alcohol use: No  . Drug use: Never    Allergies:  No Known Allergies  Medications Prior to Admission  Medication Sig Dispense Refill Last Dose  . acetaminophen (TYLENOL) 500 MG tablet Take 2 tablets (1,000 mg total) by mouth every 8 (eight) hours. 30 tablet 0 04/25/2020 at Unknown time  . levothyroxine (SYNTHROID) 200 MCG tablet Take 200 mcg by mouth daily before breakfast.   04/25/2020 at Unknown time  . Prenatal Vit-Fe Fumarate-FA (PRENATAL MULTIVITAMIN) TABS tablet Take 1 tablet by mouth daily at 12 noon.   04/25/2020 at Unknown time  . HYDROmorphone (DILAUDID) 2 MG tablet Take 1 tablet (2 mg total) by mouth every 3 (three)  hours as needed for severe pain. 30 tablet 0 Unknown at Unknown time  . promethazine (PHENERGAN) 25 MG tablet Take 1 tablet (25 mg total) by mouth every 6 (six) hours as needed for nausea or vomiting. 30 tablet 0 Unknown at Unknown time    Review of Systems  Constitutional: Negative for chills, diaphoresis, fatigue and fever.  Eyes: Negative for visual disturbance.  Respiratory: Negative for shortness of breath.   Cardiovascular: Negative for chest pain.  Gastrointestinal: Positive for abdominal pain, nausea and vomiting. Negative for constipation and diarrhea.  Genitourinary: Positive for dysuria, flank pain and hematuria. Negative for frequency, pelvic pain, urgency, vaginal bleeding and vaginal discharge.  Neurological: Negative for dizziness, weakness, light-headedness and headaches.   Physical Exam   Blood pressure 107/70, pulse (!) 103, temperature 97.6 F (36.4 C), resp. rate 18, weight 59.8 kg, SpO2 100 %, unknown if currently breastfeeding.  Patient Vitals for the past 24 hrs:  BP Temp Pulse Resp SpO2 Weight  04/25/20 2149 -- -- -- -- 100 % --  04/25/20 2145 107/70 97.6 F (36.4 C) (!) 103 18 100 % 59.8 kg   Physical Exam Vitals and nursing note reviewed.  Constitutional:      General: She is in acute distress.     Appearance: Normal appearance. She is normal weight. She is ill-appearing. She is not toxic-appearing or diaphoretic.  HENT:     Head: Normocephalic and atraumatic.  Pulmonary:     Effort: Pulmonary effort is normal.  Abdominal:     Tenderness: There is right CVA tenderness and left CVA tenderness.  Skin:    General: Skin is warm and dry.  Neurological:     Mental Status: She is alert and oriented to person, place, and time.  Psychiatric:        Mood and Affect: Mood normal.        Behavior: Behavior normal.        Thought Content: Thought content normal.        Judgment: Judgment normal.    Results for orders placed or performed during the hospital  encounter of 04/25/20 (from the past 24 hour(s))  Urinalysis, Routine w reflex microscopic Urine, Clean Catch     Status: Abnormal   Collection Time: 04/25/20 10:13 PM  Result Value Ref Range   Color, Urine AMBER (A) YELLOW   APPearance CLOUDY (A) CLEAR   Specific Gravity, Urine 1.015 1.005 - 1.030   pH 6.0 5.0 - 8.0   Glucose, UA NEGATIVE NEGATIVE mg/dL   Hgb urine dipstick LARGE (A) NEGATIVE   Bilirubin Urine NEGATIVE NEGATIVE   Ketones, ur NEGATIVE NEGATIVE mg/dL   Protein, ur 254 (A) NEGATIVE mg/dL   Nitrite NEGATIVE NEGATIVE   Leukocytes,Ua LARGE (A) NEGATIVE   RBC / HPF >50 (H) 0 - 5 RBC/hpf   WBC, UA 0-5 0 - 5 WBC/hpf   Bacteria, UA  NONE SEEN NONE SEEN   Squamous Epithelial / LPF 0-5 0 - 5  CBC with Differential/Platelet     Status: Abnormal   Collection Time: 04/25/20 11:04 PM  Result Value Ref Range   WBC 8.0 4.0 - 10.5 K/uL   RBC 3.27 (L) 3.87 - 5.11 MIL/uL   Hemoglobin 9.9 (L) 12.0 - 15.0 g/dL   HCT 63.1 (L) 36 - 46 %   MCV 91.4 80.0 - 100.0 fL   MCH 30.3 26.0 - 34.0 pg   MCHC 33.1 30.0 - 36.0 g/dL   RDW 49.7 02.6 - 37.8 %   Platelets 224 150 - 400 K/uL   nRBC 0.0 0.0 - 0.2 %   Neutrophils Relative % 65 %   Neutro Abs 5.2 1.7 - 7.7 K/uL   Lymphocytes Relative 23 %   Lymphs Abs 1.8 0.7 - 4.0 K/uL   Monocytes Relative 8 %   Monocytes Absolute 0.7 0 - 1 K/uL   Eosinophils Relative 3 %   Eosinophils Absolute 0.2 0 - 0 K/uL   Basophils Relative 0 %   Basophils Absolute 0.0 0 - 0 K/uL   Immature Granulocytes 1 %   Abs Immature Granulocytes 0.10 (H) 0.00 - 0.07 K/uL  Comprehensive metabolic panel     Status: Abnormal   Collection Time: 04/25/20 11:04 PM  Result Value Ref Range   Sodium 137 135 - 145 mmol/L   Potassium 3.4 (L) 3.5 - 5.1 mmol/L   Chloride 105 98 - 111 mmol/L   CO2 24 22 - 32 mmol/L   Glucose, Bld 80 70 - 99 mg/dL   BUN 10 6 - 20 mg/dL   Creatinine, Ser 5.88 0.44 - 1.00 mg/dL   Calcium 9.1 8.9 - 50.2 mg/dL   Total Protein 6.4 (L) 6.5 - 8.1 g/dL    Albumin 3.1 (L) 3.5 - 5.0 g/dL   AST 11 (L) 15 - 41 U/L   ALT 10 0 - 44 U/L   Alkaline Phosphatase 53 38 - 126 U/L   Total Bilirubin 0.2 (L) 0.3 - 1.2 mg/dL   GFR calc non Af Amer >60 >60 mL/min   GFR calc Af Amer >60 >60 mL/min   Anion gap 8 5 - 15   MR LUMBAR SPINE WO CONTRAST  Result Date: 04/11/2020 CLINICAL DATA:  Lower extremity weakness and sensory disturbance, rule out spinal hematoma. EXAM: MRI LUMBAR SPINE WITHOUT CONTRAST TECHNIQUE: Multiplanar, multisequence MR imaging of the lumbar spine was performed. No intravenous contrast was administered. COMPARISON:  04/05/2020 CT abdomen pelvis. FINDINGS: Segmentation:  Standard. Alignment:  Normal. Vertebrae: Normal bone marrow signal intensity. No focal osseous lesions. No bone marrow edema. Conus medullaris and cauda equina: Conus extends to the L1 level. Conus and cauda equina appear normal. No evidence of hemorrhage within the thecal sac. Disc levels: Disc spaces are preserved. L1-2: Negative L2-3: Negative L3-4: Negative L4-5: Negative L5-S1: Negative Paraspinal and other soft tissues: There is posterior body wall edema along the midline spanning the T12-L5 levels. No focal fluid collection identified. Redemonstration of moderate to severe right hydroureteronephrosis. IMPRESSION: No evidence of hematoma or intrathecal hemorrhage. Midline posterior body wall edema. No significant spinal canal or neural foraminal narrowing. Grossly unchanged moderate to severe right hydroureteronephrosis. These results were called by telephone at the time of interpretation on 04/11/2020 at 2:02 pm to provider [facility] , who verbally acknowledged these results. Electronically Signed   By: Stana Bunting M.D.   On: 04/11/2020 14:05   CT ABDOMEN  PELVIS W CONTRAST  Result Date: 04/05/2020 CLINICAL DATA:  Right lower quadrant pain EXAM: CT ABDOMEN AND PELVIS WITH CONTRAST TECHNIQUE: Multidetector CT imaging of the abdomen and pelvis was performed using the  standard protocol following bolus administration of intravenous contrast. CONTRAST:  100mL OMNIPAQUE IOHEXOL 300 MG/ML  SOLN COMPARISON:  05/04/2016 FINDINGS: Lower chest: Minimal left base atelectasis.  No effusions. Hepatobiliary: No focal hepatic abnormality. Gallbladder unremarkable. Pancreas: No focal abnormality or ductal dilatation. Spleen: No focal abnormality.  Normal size. Adrenals/Urinary Tract: Punctate 2 mm right UVJ stone with severe right hydronephrosis. 2-3 mm nonobstructing stone in the midpole of the right kidney. No stones or hydronephrosis on the left. Delayed excretion of contrast from the right kidney due to obstruction. Urinary bladder and adrenal glands unremarkable. Stomach/Bowel: Stomach, large and small bowel grossly unremarkable. Moderate stool in the colon. Vascular/Lymphatic: No evidence of aneurysm or adenopathy. Reproductive: Intrauterine gestation noted which appears of advanced gestational age. Anterior placenta. Cephalic presentation. Other: No free fluid or free air. Musculoskeletal: No acute bony abnormality. IMPRESSION: 2 mm right UVJ stone with moderate to severe right hydronephrosis. Right nephrolithiasis. Intrauterine gestation which appears to be of advanced gestational age. Moderate stool throughout the colon. Electronically Signed   By: Charlett NoseKevin  Dover M.D.   On: 04/05/2020 23:21   DG Retrograde Pyelogram  Result Date: 04/09/2020 CLINICAL DATA:  Ureteroscopy, stone extraction, intraoperative examination. EXAM: DG C-ARM 1-60 MIN; RETROGRADE PYELOGRAM CONTRAST:  Not indicated, refer to operative note. FLUOROSCOPY TIME:  Fluoroscopy Time:  27 seconds Radiation Exposure Index (if provided by the fluoroscopic device): 5.05 mGy Number of Acquired Spot Images: 4 COMPARISON:  CT 04/05/2020 FINDINGS: Four fluoroscopic intraoperative radiographs demonstrate retrograde cannulation of the right ureter and right pyelogram. No intraluminal filling defect is seen within the visualized  distal right ureter on the presented image in keeping with interval extraction of the calculus noted on prior CT examination. Subsequent images demonstrate a ureteral scope advanced into the right pelvocaliceal system. Mild right hydronephrosis is present, slightly improved since prior examination. Final 2 images demonstrate placement of a right ureteral stent in expected position IMPRESSION: Intraoperative retrograde pyelogram as described above. Electronically Signed   By: Helyn NumbersAshesh  Parikh MD   On: 04/09/2020 17:34   US RENAL  Result Date: 04/25/2020 CLINICAL DATA:  Bilateral flank pain. EXAM: RENAL / URINARY TRACT ULTRASOUND COMPLETE COMPARISON:  None. FINDINGS: Right Kidney: Renal measurements: 11.3 cm x 5.0 cm x 5.6 cm = volume: 164.8 mL. Echogenicity within normal limits. No mass visualized. There is mild right-sided hydronephrosis. Left Kidney: Renal measurements: 11.3 cm x 4.9 cm x 4.7 cm = volume: 137 mL. Echogenicity within normal limits. No mass or hydronephrosis visualized. Bladder: A right-sided endo ureteral stent is noted. A left ureteral jet is visualized. Other: None. IMPRESSION: Mild right-sided hydronephrosis. Electronically Signed   By: Aram Candelahaddeus  Houston M.D.   On: 04/25/2020 23:56   DG C-Arm 1-60 Min  Result Date: 04/09/2020 CLINICAL DATA:  Ureteroscopy, stone extraction, intraoperative examination. EXAM: DG C-ARM 1-60 MIN; RETROGRADE PYELOGRAM CONTRAST:  Not indicated, refer to operative note. FLUOROSCOPY TIME:  Fluoroscopy Time:  27 seconds Radiation Exposure Index (if provided by the fluoroscopic device): 5.05 mGy Number of Acquired Spot Images: 4 COMPARISON:  CT 04/05/2020 FINDINGS: Four fluoroscopic intraoperative radiographs demonstrate retrograde cannulation of the right ureter and right pyelogram. No intraluminal filling defect is seen within the visualized distal right ureter on the presented image in keeping with interval extraction of the calculus noted on prior  CT examination.  Subsequent images demonstrate a ureteral scope advanced into the right pelvocaliceal system. Mild right hydronephrosis is present, slightly improved since prior examination. Final 2 images demonstrate placement of a right ureteral stent in expected position IMPRESSION: Intraoperative retrograde pyelogram as described above. Electronically Signed   By: Helyn Numbers MD   On: 04/09/2020 17:34    MAU Course  Procedures  MDM -bilateral CVA tenderness, upper abdominal pain, nausea/vomiting, dysuria and hematuria in setting of recently placed stent for partially obstructing kidney stone -consulted with Dr. Despina Hidden who recommends calling on-call urologist -consulted with Dr. Retta Diones, urology, who recommends repeating US and states that bleeding can be normal. If [place] normal and pain/N/V able to be controlled, pt can call office in early AM for same day appoint to discuss removal of stent tomorrow. If abnormal results, can call back for additional discussion. -pain 7.5/10, Dilaudid 1mg  given for pain, pt reports pain now 5/10 -Zofran 8mg  ODT given for N/V, PO challenge successful -CBC: H/H 9.9/29.9 (was 9.7 2 weeks ago) -CMP: K 3.4 - : mild, right-sided hydronephrosis, otherwise WNL -EFM: reactive        -baseline: 140       -variability: moderate       -accels: present, 15x15       -decels: absent       -TOCO: irritability -pt discharged to home in stable condition  Orders Placed This Encounter  Procedures  . Culture, OB Urine    Standing Status:   Standing    Number of Occurrences:   1  . 08-07-1969 RENAL    Standing Status:   Standing    Number of Occurrences:   1    Order Specific Question:   Symptom/Reason for Exam    Answer:   Bilateral flank pain [place]  . Urinalysis, Routine w reflex microscopic Urine, Clean Catch    Standing Status:   Standing    Number of Occurrences:   1  . CBC with Differential/Platelet    Standing Status:   Standing    Number of Occurrences:   1  . Comprehensive  metabolic panel    Standing Status:   Standing    Number of Occurrences:   1  . Diet NPO time specified    Standing Status:   Standing    Number of Occurrences:   1  . Discharge patient    Order Specific Question:   Discharge disposition    Answer:   01-Home or Self Care [1]    Order Specific Question:   Discharge patient date    Answer:   04/26/2020   Meds ordered this encounter  Medications  . HYDROmorphone (DILAUDID) injection 1 mg  . ondansetron (ZOFRAN-ODT) disintegrating tablet 8 mg    Assessment and Plan   1. Gross hematuria   2. Bilateral flank pain   3. [redacted] weeks gestation of pregnancy   4. NST (non-stress test) reactive   5. History of kidney stones   6. Non-intractable vomiting with nausea, unspecified vomiting type   7. Dysuria     Allergies as of 04/26/2020   No Known Allergies     Medication List    TAKE these medications   acetaminophen 500 MG tablet Commonly known as: TYLENOL Take 2 tablets (1,000 mg total) by mouth every 8 (eight) hours.   HYDROmorphone 2 MG tablet Commonly known as: DILAUDID Take 1 tablet (2 mg total) by mouth every 3 (three) hours as needed for severe pain.   levothyroxine  200 MCG tablet Commonly known as: SYNTHROID Take 200 mcg by mouth daily before breakfast.   prenatal multivitamin Tabs tablet Take 1 tablet by mouth daily at 12 noon.   promethazine 25 MG tablet Commonly known as: PHENERGAN Take 1 tablet (25 mg total) by mouth every 6 (six) hours as needed for nausea or vomiting.      -pt to call urology office first thing in AM for same day appt to be evaluated for possible stent removal -return MAU precautions given -pt discharged to home in stable condition  Joni Reining E Daaiel Starlin 04/26/2020, 1:07 AM

## 2020-04-25 NOTE — MAU Note (Signed)
Jenny Barnes is a 24 y.o. at [redacted]w[redacted]d here in MAU reporting:" abdominal pain and back pain, most of the pain is on right side where she has a stent for kidney stone, has started to pee blood again" denies  VB/LOF+FM  Onset of complaint: Thursday night Pain score: 7 Vitals:   04/25/20 2145  BP: 107/70  Pulse: (!) 103  Resp: 18  Temp: 97.6 F (36.4 C)  SpO2: 100%     FHT:134 Lab orders placed from triage:  u/a

## 2020-04-26 DIAGNOSIS — O219 Vomiting of pregnancy, unspecified: Secondary | ICD-10-CM

## 2020-04-26 DIAGNOSIS — Z3A32 32 weeks gestation of pregnancy: Secondary | ICD-10-CM

## 2020-04-26 DIAGNOSIS — O99891 Other specified diseases and conditions complicating pregnancy: Secondary | ICD-10-CM

## 2020-04-26 DIAGNOSIS — R109 Unspecified abdominal pain: Secondary | ICD-10-CM

## 2020-04-26 DIAGNOSIS — R319 Hematuria, unspecified: Secondary | ICD-10-CM

## 2020-04-26 DIAGNOSIS — R300 Dysuria: Secondary | ICD-10-CM

## 2020-04-26 DIAGNOSIS — Z87442 Personal history of urinary calculi: Secondary | ICD-10-CM

## 2020-04-26 LAB — CULTURE, OB URINE: Culture: <10000 — AB

## 2020-04-26 NOTE — Discharge Instructions (Signed)
Abdominal Pain During Pregnancy  Abdominal pain is common during pregnancy, and has many possible causes. Some causes are more serious than others, and sometimes the cause is not known. Abdominal pain can be a sign that labor is starting. It can also be caused by normal growth and stretching of muscles and ligaments during pregnancy. Always tell your health care provider if you have any abdominal pain. Follow these instructions at home:  Do not have sex or put anything in your vagina until your pain goes away completely.  Get plenty of rest until your pain improves.  Drink enough fluid to keep your urine pale yellow.  Take over-the-counter and prescription medicines only as told by your health care provider.  Keep all follow-up visits as told by your health care provider. This is important. Contact a health care provider if:  Your pain continues or gets worse after resting.  You have lower abdominal pain that: ? Comes and goes at regular intervals. ? Spreads to your back. ? Is similar to menstrual cramps.  You have pain or burning when you urinate. Get help right away if:  You have a fever or chills.  You have vaginal bleeding.  You are leaking fluid from your vagina.  You are passing tissue from your vagina.  You have vomiting or diarrhea that lasts for more than 24 hours.  Your baby is moving less than usual.  You feel very weak or faint.  You have shortness of breath.  You develop severe pain in your upper abdomen. Summary  Abdominal pain is common during pregnancy, and has many possible causes.  If you experience abdominal pain during pregnancy, tell your health care provider right away.  Follow your health care provider's home care instructions and keep all follow-up visits as directed. This information is not intended to replace advice given to you by your health care provider. Make sure you discuss any questions you have with your health care  provider. Document Revised: 12/02/2018 Document Reviewed: 11/16/2016 Elsevier Patient Education  2020 Elsevier Inc.        Preterm Labor and Birth Information  The normal length of a pregnancy is 39-41 weeks. Preterm labor is when labor starts before 37 completed weeks of pregnancy. What are the risk factors for preterm labor? Preterm labor is more likely to occur in women who:  Have certain infections during pregnancy such as a bladder infection, sexually transmitted infection, or infection inside the uterus (chorioamnionitis).  Have a shorter-than-normal cervix.  Have gone into preterm labor before.  Have had surgery on their cervix.  Are younger than age 17 or older than age 35.  Are African American.  Are pregnant with twins or multiple babies (multiple gestation).  Take street drugs or smoke while pregnant.  Do not gain enough weight while pregnant.  Became pregnant shortly after having been pregnant. What are the symptoms of preterm labor? Symptoms of preterm labor include:  Cramps similar to those that can happen during a menstrual period. The cramps may happen with diarrhea.  Pain in the abdomen or lower back.  Regular uterine contractions that may feel like tightening of the abdomen.  A feeling of increased pressure in the pelvis.  Increased watery or bloody mucus discharge from the vagina.  Water breaking (ruptured amniotic sac). Why is it important to recognize signs of preterm labor? It is important to recognize signs of preterm labor because babies who are born prematurely may not be fully developed. This can put them at   an increased risk for:  Long-term (chronic) heart and lung problems.  Difficulty immediately after birth with regulating body systems, including blood sugar, body temperature, heart rate, and breathing rate.  Bleeding in the brain.  Cerebral palsy.  Learning difficulties.  Death. These risks are highest for babies who are  born before 34 weeks of pregnancy. How is preterm labor treated? Treatment depends on the length of your pregnancy, your condition, and the health of your baby. It may involve:  Having a stitch (suture) placed in your cervix to prevent your cervix from opening too early (cerclage).  Taking or being given medicines, such as: ? Hormone medicines. These may be given early in pregnancy to help support the pregnancy. ? Medicine to stop contractions. ? Medicines to help mature the baby's lungs. These may be prescribed if the risk of delivery is high. ? Medicines to prevent your baby from developing cerebral palsy. If the labor happens before 34 weeks of pregnancy, you may need to stay in the hospital. What should I do if I think I am in preterm labor? If you think that you are going into preterm labor, call your health care provider right away. How can I prevent preterm labor in future pregnancies? To increase your chance of having a full-term pregnancy:  Do not use any tobacco products, such as cigarettes, chewing tobacco, and e-cigarettes. If you need help quitting, ask your health care provider.  Do not use street drugs or medicines that have not been prescribed to you during your pregnancy.  Talk with your health care provider before taking any herbal supplements, even if you have been taking them regularly.  Make sure you gain a healthy amount of weight during your pregnancy.  Watch for infection. If you think that you might have an infection, get it checked right away.  Make sure to tell your health care provider if you have gone into preterm labor before. This information is not intended to replace advice given to you by your health care provider. Make sure you discuss any questions you have with your health care provider. Document Revised: 12/06/2018 Document Reviewed: 01/05/2016 Elsevier Patient Education  2020 Elsevier Inc.  

## 2020-04-27 ENCOUNTER — Other Ambulatory Visit: Payer: Self-pay

## 2020-04-27 ENCOUNTER — Encounter (HOSPITAL_COMMUNITY): Payer: Self-pay | Admitting: Obstetrics and Gynecology

## 2020-04-27 ENCOUNTER — Inpatient Hospital Stay (HOSPITAL_COMMUNITY)
Admission: AD | Admit: 2020-04-27 | Discharge: 2020-04-27 | Disposition: A | Discharge status: Home / Self Care | Payer: Medicaid Other | Attending: Obstetrics and Gynecology | Admitting: Obstetrics and Gynecology

## 2020-04-27 DIAGNOSIS — R109 Unspecified abdominal pain: Secondary | ICD-10-CM | POA: Insufficient documentation

## 2020-04-27 DIAGNOSIS — Z3689 Encounter for other specified antenatal screening: Secondary | ICD-10-CM | POA: Insufficient documentation

## 2020-04-27 DIAGNOSIS — O212 Late vomiting of pregnancy: Secondary | ICD-10-CM | POA: Diagnosis not present

## 2020-04-27 DIAGNOSIS — O4703 False labor before 37 completed weeks of gestation, third trimester: Secondary | ICD-10-CM | POA: Insufficient documentation

## 2020-04-27 DIAGNOSIS — O26893 Other specified pregnancy related conditions, third trimester: Secondary | ICD-10-CM | POA: Insufficient documentation

## 2020-04-27 DIAGNOSIS — O99891 Other specified diseases and conditions complicating pregnancy: Secondary | ICD-10-CM | POA: Diagnosis not present

## 2020-04-27 DIAGNOSIS — E039 Hypothyroidism, unspecified: Secondary | ICD-10-CM | POA: Insufficient documentation

## 2020-04-27 DIAGNOSIS — O99283 Endocrine, nutritional and metabolic diseases complicating pregnancy, third trimester: Secondary | ICD-10-CM | POA: Insufficient documentation

## 2020-04-27 DIAGNOSIS — Z79899 Other long term (current) drug therapy: Secondary | ICD-10-CM | POA: Insufficient documentation

## 2020-04-27 DIAGNOSIS — G8929 Other chronic pain: Secondary | ICD-10-CM | POA: Insufficient documentation

## 2020-04-27 DIAGNOSIS — Z3A32 32 weeks gestation of pregnancy: Secondary | ICD-10-CM | POA: Diagnosis not present

## 2020-04-27 DIAGNOSIS — O479 False labor, unspecified: Secondary | ICD-10-CM

## 2020-04-27 HISTORY — DX: Calculus of kidney: N20.0

## 2020-04-27 LAB — URINALYSIS, ROUTINE W REFLEX MICROSCOPIC
Bilirubin Urine: NEGATIVE
Glucose, UA: NEGATIVE mg/dL
Hgb urine dipstick: NEGATIVE
Ketones, ur: NEGATIVE mg/dL
Nitrite: NEGATIVE
Protein, ur: NEGATIVE mg/dL
Specific Gravity, Urine: 1.018 (ref 1.005–1.030)
pH: 6 (ref 5.0–8.0)

## 2020-04-27 LAB — WET PREP, GENITAL
Clue Cells Wet Prep HPF POC: NONE SEEN
Sperm: NONE SEEN
Trich, Wet Prep: NONE SEEN
Yeast Wet Prep HPF POC: NONE SEEN

## 2020-04-27 MED ORDER — NIFEDIPINE 10 MG PO CAPS
10.0000 mg | ORAL_CAPSULE | Freq: Once | ORAL | Status: AC
  Administered 2020-04-27: 10 mg via ORAL
  Filled 2020-04-27: qty 1

## 2020-04-27 MED ORDER — CYCLOBENZAPRINE HCL 5 MG PO TABS
10.0000 mg | ORAL_TABLET | Freq: Once | ORAL | Status: AC
  Administered 2020-04-27: 10 mg via ORAL
  Filled 2020-04-27: qty 2

## 2020-04-27 MED ORDER — CYCLOBENZAPRINE HCL 10 MG PO TABS: 10.0000 mg | ORAL_TABLET | Freq: Two times a day (BID) | ORAL | 0 refills | Status: DC | PRN

## 2020-04-27 MED ORDER — TERBUTALINE SULFATE 1 MG/ML IJ SOLN
0.2500 mg | Freq: Once | INTRAMUSCULAR | Status: AC
  Administered 2020-04-27: 0.25 mg via SUBCUTANEOUS
  Filled 2020-04-27: qty 1

## 2020-04-27 NOTE — MAU Note (Signed)
Pt reports her abdominal pain and cramping has remained the same and her CTX have not slowed down or decreased in intensity. Pt reports she is feeling her CTX every 4 minutes. Pt has finished 1/2 of her pitcher of water. Pt states she feels the pain from her CTX is so high because she is already experiencing the abdominal cramping. Pt is concerned about her abdominal pain because it is a new feeling verses the pain she has been experiencing previously since the pain was always in her back.

## 2020-04-27 NOTE — MAU Note (Signed)
...  Jenny Barnes is a 24 y.o. at 108w4d here in MAU reporting: yesterday she had her right ureter stent removed and since then she has been experiencing CTX that have been irregular. Pt reports she is not as concerned about her CTX as much as she is her abdominal cramping. Pt reports she spoke with her OB today and they instructed her to come to MAU if her CTX lasted longer than an hour and it didn't matter how far apart they were. Pt rates her abdominal pain and cramping at an 8/10. Pt reports her CTX when she feels them in her lower abdomen are an 8/10 but when the CTX is felt vaginally it is a 3/10.   Pt reports she feels as if the baby is not moving as much but feels it may be because she is focused more on the pain that she is feeling. No VB or abdominal discharge.   Medication: 2 extra strength Tylenol at 1730  FHT: 165 external Lab orders placed from triage:  UA

## 2020-04-27 NOTE — MAU Provider Note (Addendum)
History     CSN: [id number]  Arrival date and time: 04/27/20 3614   First Provider Initiated Contact with Patient 04/27/20 1936      Chief Complaint  Patient presents with  . Contractions  . Abdominal Cramping   24 y.o. G2P1001 @32 .4 wks presenting with ctx and cramping. Sx started after she had her ureteral stent removed yesterday. Ctx are irregular but the cramping is constant and worse on right side. Cramping worsened to earlier today. Had 2 episodes of N/V earlier today d/t pain. Denies VB, LOF, or vaginal discharge. +FM. Denies urinary sx except frequncy but this is not a new sx. Reports improvement of her back pain.   OB History    Gravida  2   Para  1   Term  1   Preterm      AB      Living  1     SAB      TAB      Ectopic      Multiple  0   Live Births  1           Past Medical History:  Diagnosis Date  . Hypothyroidism   . Kidney stone   . Thyroid disease     Past Surgical History:  Procedure Laterality Date  . CYSTOSCOPY WITH RETROGRADE PYELOGRAM, URETEROSCOPY AND STENT PLACEMENT Right 04/09/2020   Procedure: URETEROSCOPY, BASKETING OF STONE & DOUBLE J. STENT PLACEMENT;  Surgeon: 04/11/2020, MD;  Location: [facility];  Service: Urology;  Laterality: Right;  . WISDOM TOOTH EXTRACTION      Family History  Problem Relation Age of Onset  . Kidney Stones Mother   . Kidney Stones Sister     Social History   Tobacco Use  . Smoking status: Never Smoker  . Smokeless tobacco: Never Used  Vaping Use  . Vaping Use: Never used  Substance Use Topics  . Alcohol use: No  . Drug use: Never    Allergies: No Known Allergies  Medications Prior to Admission  Medication Sig Dispense Refill Last Dose  . acetaminophen (TYLENOL) 500 MG tablet Take 2 tablets (1,000 mg total) by mouth every 8 (eight) hours. 30 tablet 0 04/27/2020 at 1730  . levothyroxine (SYNTHROID) 200 MCG tablet Take 200 mcg by mouth daily before breakfast.   04/27/2020 at Unknown time   . Prenatal Vit-Fe Fumarate-FA (PRENATAL MULTIVITAMIN) TABS tablet Take 1 tablet by mouth daily at 12 noon.   04/27/2020 at Unknown time  . HYDROmorphone (DILAUDID) 2 MG tablet Take 1 tablet (2 mg total) by mouth every 3 (three) hours as needed for severe pain. 30 tablet 0   . promethazine (PHENERGAN) 25 MG tablet Take 1 tablet (25 mg total) by mouth every 6 (six) hours as needed for nausea or vomiting. 30 tablet 0     Review of Systems  Gastrointestinal: Positive for abdominal pain, nausea and vomiting.  Genitourinary: Positive for frequency. Negative for dysuria, hematuria, urgency, vaginal bleeding and vaginal discharge.   Physical Exam   Blood pressure 103/67, pulse (!) 101, temperature 97.9 F (36.6 C), temperature source Oral, resp. rate 17, SpO2 98 %, unknown if currently breastfeeding.  Physical Exam Vitals and nursing note reviewed. Exam conducted with a chaperone present.  Constitutional:      General: She is not in acute distress.    Appearance: Normal appearance.  HENT:     Head: Normocephalic and atraumatic.  Pulmonary:     Effort: Pulmonary effort is normal. No  respiratory distress.  Abdominal:     Palpations: Abdomen is soft.     Tenderness: There is no abdominal tenderness.     Comments: gravid  Genitourinary:    Comments: VE: closed/thick Musculoskeletal:        General: Normal range of motion.     Cervical back: Normal range of motion.  Skin:    General: Skin is warm and dry.  Neurological:     General: No focal deficit present.     Mental Status: She is alert and oriented to person, place, and time.  Psychiatric:        Mood and Affect: Mood normal.   EFM: 145 bpm, mod variability, + accels, no decels Toco: irritability  Results for orders placed or performed during the hospital encounter of 04/27/20 (from the past 24 hour(s))  Urinalysis, Routine w reflex microscopic Urine, Clean Catch     Status: Abnormal   Collection Time: 04/27/20  7:06 PM  Result  Value Ref Range   Color, Urine YELLOW YELLOW   APPearance CLOUDY (A) CLEAR   Specific Gravity, Urine 1.018 1.005 - 1.030   pH 6.0 5.0 - 8.0   Glucose, UA NEGATIVE NEGATIVE mg/dL   Hgb urine dipstick NEGATIVE NEGATIVE   Bilirubin Urine NEGATIVE NEGATIVE   Ketones, ur NEGATIVE NEGATIVE mg/dL   Protein, ur NEGATIVE NEGATIVE mg/dL   Nitrite NEGATIVE NEGATIVE   Leukocytes,Ua LARGE (A) NEGATIVE   RBC / HPF 6-10 0 - 5 RBC/hpf   WBC, UA 11-20 0 - 5 WBC/hpf   Bacteria, UA FEW (A) NONE SEEN   Squamous Epithelial / LPF 21-50 0 - 5   Mucus PRESENT    Hyaline Casts, UA PRESENT   Wet prep, genital     Status: Abnormal   Collection Time: 04/27/20  7:55 PM  Result Value Ref Range   Yeast Wet Prep HPF POC NONE SEEN NONE SEEN   Trich, Wet Prep NONE SEEN NONE SEEN   Clue Cells Wet Prep HPF POC NONE SEEN NONE SEEN   WBC, Wet Prep HPF POC MANY (A) NONE SEEN   Sperm NONE SEEN    MAU Course  Procedures Po hydration Procardia  MDM Labs ordered and reviewed. Pt reports no improvement after Procardia, UI on toco, Terbutaline ordered. UA with large leuks and WBCs, UC ordered.  Transfer of care given to Rosalita Levan, CNM  04/27/2020 9:19 PM   Reassessment @2200  after terbutaline. Patient reports that contractions have spaced out to every 14 minutes and not as painful. Patient reports right flank pain and radiates to umbilicus is still present. Rates pain 7/10.  Educated and discussed Flexeril with patient - One dose of Flexeril ordered in MA, plan to reassess after medication.   Reassessment @2305 - patient reports contractions have continued to space and that pain is better.  Discussed reasons to return to MAU. Follow up as scheduled in the office. Return to MAU as needed. Pt stable at time of discharge. Rx for Flexeril sent to pharmacy of choice for home use as needed.  Assessment and Plan   1. Braxton Hicks contractions   2. Chronic right flank pain   3. [redacted] weeks gestation of  pregnancy   4. NST (non-stress test) reactive    Discharge home Follow up as scheduled in the office for prenatal care Return to MAU as needed for reasons discussed and/or emergencies  Rx for Flexeril    Follow-up Information    Ob/Gyn, [facility] Follow up.  Contact information: [address] [place] [address] [phone]              Allergies as of 04/27/2020   No Known Allergies     Medication List    TAKE these medications   acetaminophen 500 MG tablet Commonly known as: TYLENOL Take 2 tablets (1,000 mg total) by mouth every 8 (eight) hours.   cyclobenzaprine 10 MG tablet Commonly known as: FLEXERIL Take 1 tablet (10 mg total) by mouth 2 (two) times daily as needed for muscle spasms.   HYDROmorphone 2 MG tablet Commonly known as: DILAUDID Take 1 tablet (2 mg total) by mouth every 3 (three) hours as needed for severe pain.   levothyroxine 200 MCG tablet Commonly known as: SYNTHROID Take 200 mcg by mouth daily before breakfast.   prenatal multivitamin Tabs tablet Take 1 tablet by mouth daily at 12 noon.   promethazine 25 MG tablet Commonly known as: PHENERGAN Take 1 tablet (25 mg total) by mouth every 6 (six) hours as needed for nausea or vomiting.      Sharyon Cable, CNM 04/28/20, 12:36 AM

## 2020-04-28 DIAGNOSIS — R1084 Generalized abdominal pain: Secondary | ICD-10-CM | POA: Diagnosis not present

## 2020-04-28 DIAGNOSIS — Z87442 Personal history of urinary calculi: Secondary | ICD-10-CM | POA: Diagnosis not present

## 2020-04-28 DIAGNOSIS — R8271 Bacteriuria: Secondary | ICD-10-CM | POA: Diagnosis not present

## 2020-04-28 LAB — GC/CHLAMYDIA PROBE AMP (~~LOC~~) NOT AT ARMC
Chlamydia: NEGATIVE
Comment: NEGATIVE
Comment: NORMAL
Neisseria Gonorrhea: NEGATIVE

## 2020-04-29 DIAGNOSIS — N200 Calculus of kidney: Secondary | ICD-10-CM | POA: Diagnosis not present

## 2020-05-02 ENCOUNTER — Other Ambulatory Visit: Payer: Self-pay

## 2020-05-02 ENCOUNTER — Inpatient Hospital Stay (HOSPITAL_COMMUNITY)
Admission: AD | Admit: 2020-05-02 | Discharge: 2020-05-02 | Disposition: A | Discharge status: Home / Self Care | Payer: Medicaid Other | Attending: Obstetrics and Gynecology | Admitting: Obstetrics and Gynecology

## 2020-05-02 ENCOUNTER — Encounter (HOSPITAL_COMMUNITY): Payer: Self-pay | Admitting: Obstetrics and Gynecology

## 2020-05-02 DIAGNOSIS — Z79890 Hormone replacement therapy (postmenopausal): Secondary | ICD-10-CM | POA: Insufficient documentation

## 2020-05-02 DIAGNOSIS — N132 Hydronephrosis with renal and ureteral calculous obstruction: Secondary | ICD-10-CM | POA: Diagnosis not present

## 2020-05-02 DIAGNOSIS — O99891 Other specified diseases and conditions complicating pregnancy: Secondary | ICD-10-CM | POA: Diagnosis not present

## 2020-05-02 DIAGNOSIS — O99283 Endocrine, nutritional and metabolic diseases complicating pregnancy, third trimester: Secondary | ICD-10-CM | POA: Diagnosis not present

## 2020-05-02 DIAGNOSIS — Z87442 Personal history of urinary calculi: Secondary | ICD-10-CM | POA: Insufficient documentation

## 2020-05-02 DIAGNOSIS — Z3A33 33 weeks gestation of pregnancy: Secondary | ICD-10-CM | POA: Insufficient documentation

## 2020-05-02 DIAGNOSIS — E039 Hypothyroidism, unspecified: Secondary | ICD-10-CM | POA: Insufficient documentation

## 2020-05-02 DIAGNOSIS — O26893 Other specified pregnancy related conditions, third trimester: Secondary | ICD-10-CM | POA: Insufficient documentation

## 2020-05-02 DIAGNOSIS — Z79899 Other long term (current) drug therapy: Secondary | ICD-10-CM | POA: Insufficient documentation

## 2020-05-02 DIAGNOSIS — N3289 Other specified disorders of bladder: Secondary | ICD-10-CM | POA: Diagnosis not present

## 2020-05-02 DIAGNOSIS — Z3689 Encounter for other specified antenatal screening: Secondary | ICD-10-CM

## 2020-05-02 LAB — URINALYSIS, ROUTINE W REFLEX MICROSCOPIC
Bilirubin Urine: NEGATIVE
Glucose, UA: NEGATIVE mg/dL
Hgb urine dipstick: NEGATIVE
Ketones, ur: NEGATIVE mg/dL
Nitrite: NEGATIVE
Protein, ur: NEGATIVE mg/dL
Specific Gravity, Urine: 1.005 (ref 1.005–1.030)
pH: 7 (ref 5.0–8.0)

## 2020-05-02 LAB — CULTURE, OB URINE: Culture: 50000 — AB

## 2020-05-02 LAB — FETAL FIBRONECTIN: Fetal Fibronectin: NEGATIVE

## 2020-05-02 LAB — AMNISURE RUPTURE OF MEMBRANE (ROM) NOT AT ARMC: Amnisure ROM: NEGATIVE

## 2020-05-02 MED ORDER — HYOSCYAMINE SULFATE 0.125 MG SL SUBL
0.1250 mg | SUBLINGUAL_TABLET | Freq: Once | SUBLINGUAL | Status: DC
  Filled 2020-05-02: qty 1

## 2020-05-02 MED ORDER — HYOSCYAMINE SULFATE 0.125 MG SL SUBL
0.1250 mg | SUBLINGUAL_TABLET | SUBLINGUAL | 0 refills | Status: DC | PRN
Start: 2020-05-02 — End: 2020-05-07

## 2020-05-02 MED ORDER — HYOSCYAMINE SULFATE 0.125 MG SL SUBL
0.1250 mg | SUBLINGUAL_TABLET | Freq: Once | SUBLINGUAL | Status: AC
  Administered 2020-05-02: 0.125 mg via SUBLINGUAL
  Filled 2020-05-02: qty 1

## 2020-05-02 MED ORDER — HYOSCYAMINE SULFATE 0.125 MG PO TABS
0.1250 mg | ORAL_TABLET | Freq: Once | ORAL | Status: DC
  Filled 2020-05-02: qty 1

## 2020-05-02 NOTE — MAU Note (Signed)
.   Jenny Barnes is a 24 y.o. at [redacted]w[redacted]d here in MAU reporting: ctx that became more intense today at 1300. She states that she never stopped contracting since the last time she was in MAU but they were not consistent or intense until today. No VB. She states that she is leaking fluid but is unsure if it is vaginal discharge. No recent intercourse. Endorses good fetal movement.   Pain score: 8 Vitals:   05/02/20 2009  BP: 121/73  Pulse: 85  Resp: 14  Temp: 98 F (36.7 C)  SpO2: 100%     FHT:150 Lab orders placed from triage: UA

## 2020-05-02 NOTE — Discharge Instructions (Signed)
Preterm Labor and Birth Information  The normal length of a pregnancy is 39-41 weeks. Preterm labor is when labor starts before 37 completed weeks of pregnancy. What are the risk factors for preterm labor? Preterm labor is more likely to occur in women who:  Have certain infections during pregnancy such as a bladder infection, sexually transmitted infection, or infection inside the uterus (chorioamnionitis).  Have a shorter-than-normal cervix.  Have gone into preterm labor before.  Have had surgery on their cervix.  Are younger than age 17 or older than age 35.  Are African American.  Are pregnant with twins or multiple babies (multiple gestation).  Take street drugs or smoke while pregnant.  Do not gain enough weight while pregnant.  Became pregnant shortly after having been pregnant. What are the symptoms of preterm labor? Symptoms of preterm labor include:  Cramps similar to those that can happen during a menstrual period. The cramps may happen with diarrhea.  Pain in the abdomen or lower back.  Regular uterine contractions that may feel like tightening of the abdomen.  A feeling of increased pressure in the pelvis.  Increased watery or bloody mucus discharge from the vagina.  Water breaking (ruptured amniotic sac). Why is it important to recognize signs of preterm labor? It is important to recognize signs of preterm labor because babies who are born prematurely may not be fully developed. This can put them at an increased risk for:  Long-term (chronic) heart and lung problems.  Difficulty immediately after birth with regulating body systems, including blood sugar, body temperature, heart rate, and breathing rate.  Bleeding in the brain.  Cerebral palsy.  Learning difficulties.  Death. These risks are highest for babies who are born before 34 weeks of pregnancy. How is preterm labor treated? Treatment depends on the length of your pregnancy, your condition,  and the health of your baby. It may involve:  Having a stitch (suture) placed in your cervix to prevent your cervix from opening too early (cerclage).  Taking or being given medicines, such as: ? Hormone medicines. These may be given early in pregnancy to help support the pregnancy. ? Medicine to stop contractions. ? Medicines to help mature the baby's lungs. These may be prescribed if the risk of delivery is high. ? Medicines to prevent your baby from developing cerebral palsy. If the labor happens before 34 weeks of pregnancy, you may need to stay in the hospital. What should I do if I think I am in preterm labor? If you think that you are going into preterm labor, call your health care provider right away. How can I prevent preterm labor in future pregnancies? To increase your chance of having a full-term pregnancy:  Do not use any tobacco products, such as cigarettes, chewing tobacco, and e-cigarettes. If you need help quitting, ask your health care provider.  Do not use street drugs or medicines that have not been prescribed to you during your pregnancy.  Talk with your health care provider before taking any herbal supplements, even if you have been taking them regularly.  Make sure you gain a healthy amount of weight during your pregnancy.  Watch for infection. If you think that you might have an infection, get it checked right away.  Make sure to tell your health care provider if you have gone into preterm labor before. This information is not intended to replace advice given to you by your health care provider. Make sure you discuss any questions you have with your   health care provider. Document Revised: 12/06/2018 Document Reviewed: 01/05/2016 Elsevier Patient Education  2020 [employer] Fetal Movement Counts Patient Name: ________________________________________________ Patient Due Date: ____________________ What is a fetal movement count?  A fetal movement count is the  number of times that you feel your baby move during a certain amount of time. This may also be called a fetal kick count. A fetal movement count is recommended for every pregnant woman. You may be asked to start counting fetal movements as early as week 28 of your pregnancy. Pay attention to when your baby is most active. You may notice your baby's sleep and wake cycles. You may also notice things that make your baby move more. You should do a fetal movement count:  When your baby is normally most active.  At the same time each day. A good time to count movements is while you are resting, after having something to eat and drink. How do I count fetal movements? 1. Find a quiet, comfortable area. Sit, or lie down on your side. 2. Write down the date, the start time and stop time, and the number of movements that you felt between those two times. Take this information with you to your health care visits. 3. Write down your start time when you feel the first movement. 4. Count kicks, flutters, swishes, rolls, and jabs. You should feel at least 10 movements. 5. You may stop counting after you have felt 10 movements, or if you have been counting for 2 hours. Write down the stop time. 6. If you do not feel 10 movements in 2 hours, contact your health care provider for further instructions. Your health care provider may want to do additional tests to assess your baby's well-being. Contact a health care provider if:  You feel fewer than 10 movements in 2 hours.  Your baby is not moving like he or she usually does. Date: ____________ Start time: ____________ Stop time: ____________ Movements: ____________ Date: ____________ Start time: ____________ Stop time: ____________ Movements: ____________ Date: ____________ Start time: ____________ Stop time: ____________ Movements: ____________ Date: ____________ Start time: ____________ Stop time: ____________ Movements: ____________ Date: ____________ Start  time: ____________ Stop time: ____________ Movements: ____________ Date: ____________ Start time: ____________ Stop time: ____________ Movements: ____________ Date: ____________ Start time: ____________ Stop time: ____________ Movements: ____________ Date: ____________ Start time: ____________ Stop time: ____________ Movements: ____________ Date: ____________ Start time: ____________ Stop time: ____________ Movements: ____________ This information is not intended to replace advice given to you by your health care provider. Make sure you discuss any questions you have with your health care provider. Document Revised: 04/03/2019 Document Reviewed: 04/03/2019 Elsevier Patient Education  2020 [employer]  

## 2020-05-02 NOTE — MAU Provider Note (Signed)
History     CSN: [id number]  Arrival date and time: 05/02/20 7353   First Provider Initiated Contact with Patient 05/02/20 2032      Chief Complaint  Patient presents with  . Contractions   [name] is a 24 y.o. G2P1001 at [redacted]w[redacted]d who presents to MAU for PTL evaluation after ctx began strongly at 1PM this afternoon. Pt is s/p stent removal 04/26/2020 after she had a partially obstructing stone. Patient reports as soon as she had her stent removed, she returned to the hospital on Tuesday for contractions. Patient reports she was given oral fluid, Procardia and terbutaline to stop contractions. Pt reports Procardia did not work for her and terbutaline gave her a panic attack, but reports the terbutaline worked. Patient reports she is willing to have terbutaline again, but only as a last resort. Patient reports she saw urology x3 this week due to stent removal, increased back/abdominal pain and was put in hydrocodone, which helped somewhat. Patient also endorses nausea and vomiting since stent removal and is taking Zofran PO, which does not help as much as Phenergan, but pt reports she cannot take the sedation of Phenergan in her daily life. Patient reports she is being followed closely by urology.  Pt reports she came to MAU today for contractions, that have been on-going since she was here on 08/31, which have gotten stronger, specifically since 1PM this afternoon. Patient reports when they do get stronger they are spacing out. Patient reports she has not noticed her stomach tightening in the past two hours and instead reports all of the pain is lower down. Contractions not able to be palpated at bedside.  Patient also reports possible LOF while she [place] the rode[place] when she experienced a gush of clear, odorless fluid that soaked through her pants. Patient reports she believes she may have continued to leak fluid, but denies wearing a pad since then.  Pt denies ctx,  change in vaginal discharge amount/color/consistency, VB, new onset backache, intermittent abdominal discomfort/pain, pelvic pressure/pain, cramping. Pt denies chest pain and SOB.  Pt denies constipation, diarrhea, or urinary problems. Pt denies fever, chills, fatigue, sweating or changes in appetite. Pt denies dizziness, light-headedness, weakness.  Pt denies VB, LOF and reports good FM.  Current pregnancy problems? S/p stent, hypothyroid Blood Type? B Positive Allergies? NKDA Current medications? Hydrocodone, Zofran, Synthroid, Keflex, Flexeril Current PNC & next appt? [facility], 2 weeks   Mother present for entire visit.   OB History    Gravida  2   Para  1   Term  1   Preterm      AB      Living  1     SAB      TAB      Ectopic      Multiple  0   Live Births  1           Past Medical History:  Diagnosis Date  . Hypothyroidism   . Kidney stone   . Thyroid disease     Past Surgical History:  Procedure Laterality Date  . CYSTOSCOPY W/ URETERAL STENT REMOVAL    . CYSTOSCOPY WITH RETROGRADE PYELOGRAM, URETEROSCOPY AND STENT PLACEMENT Right 04/09/2020   Procedure: URETEROSCOPY, BASKETING OF STONE & DOUBLE J. STENT PLACEMENT;  Surgeon: Sebastian Ache, MD;  Location: [facility] OR;  Service: Urology;  Laterality: Right;  . WISDOM TOOTH EXTRACTION      Family History  Problem Relation Age of Onset  .  Kidney Stones Mother   . Kidney Stones Sister     Social History   Tobacco Use  . Smoking status: Never Smoker  . Smokeless tobacco: Never Used  Vaping Use  . Vaping Use: Never used  Substance Use Topics  . Alcohol use: No  . Drug use: Never    Allergies: No Known Allergies  Medications Prior to Admission  Medication Sig Dispense Refill Last Dose  . acetaminophen (TYLENOL) 500 MG tablet Take 2 tablets (1,000 mg total) by mouth every 8 (eight) hours. 30 tablet 0 05/02/2020 at Unknown time  . cyclobenzaprine (FLEXERIL) 10 MG tablet Take 1 tablet (10  mg total) by mouth 2 (two) times daily as needed for muscle spasms. 20 tablet 0 05/01/2020 at Unknown time  . levothyroxine (SYNTHROID) 200 MCG tablet Take 200 mcg by mouth daily before breakfast.   05/02/2020 at Unknown time  . ondansetron (ZOFRAN) 4 MG tablet Take 4 mg by mouth every 8 (eight) hours as needed for nausea or vomiting.   05/02/2020 at Unknown time  . Prenatal Vit-Fe Fumarate-FA (PRENATAL MULTIVITAMIN) TABS tablet Take 1 tablet by mouth daily at 12 noon.   05/02/2020 at Unknown time  . promethazine (PHENERGAN) 25 MG tablet Take 1 tablet (25 mg total) by mouth every 6 (six) hours as needed for nausea or vomiting. 30 tablet 0 Past Week at Unknown time  . HYDROmorphone (DILAUDID) 2 MG tablet Take 1 tablet (2 mg total) by mouth every 3 (three) hours as needed for severe pain. 30 tablet 0     Review of Systems  Constitutional: Negative for chills, diaphoresis, fatigue and fever.  Eyes: Negative for visual disturbance.  Respiratory: Negative for shortness of breath.   Cardiovascular: Negative for chest pain.  Gastrointestinal: Positive for abdominal pain. Negative for constipation, diarrhea, nausea and vomiting.  Genitourinary: Positive for vaginal discharge and vaginal pain. Negative for dysuria, flank pain, frequency, pelvic pain, urgency and vaginal bleeding.  Neurological: Negative for dizziness, weakness, light-headedness and headaches.   Physical Exam   Blood pressure 121/73, pulse 85, temperature 98 F (36.7 C), temperature source Oral, resp. rate 14, SpO2 100 %, unknown if currently breastfeeding.  Patient Vitals for the past 24 hrs:  BP Temp Temp src Pulse Resp SpO2  05/02/20 2009 121/73 98 F (36.7 C) Oral 85 14 100 %   Physical Exam Vitals and nursing note reviewed. Exam conducted with a chaperone present.  Constitutional:      General: She is not in acute distress.    Appearance: Normal appearance. She is normal weight. She is not ill-appearing, toxic-appearing or  diaphoretic.  HENT:     Head: Normocephalic and atraumatic.  Pulmonary:     Effort: Pulmonary effort is normal.  Abdominal:     Palpations: Abdomen is soft. There is no mass.     Tenderness: There is no abdominal tenderness. There is no right CVA tenderness, left CVA tenderness, guarding or rebound.  Skin:    General: Skin is warm and dry.  Neurological:     Mental Status: She is alert and oriented to person, place, and time.  Psychiatric:        Mood and Affect: Mood normal.        Behavior: Behavior normal.        Thought Content: Thought content normal.        Judgment: Judgment normal.    Results for orders placed or performed during the hospital encounter of 05/02/20 (from the past 24 hour(s))  Urinalysis, Routine w reflex microscopic Urine, Clean Catch     Status: Abnormal   Collection Time: 05/02/20  8:03 PM  Result Value Ref Range   Color, Urine STRAW (A) YELLOW   APPearance CLEAR CLEAR   Specific Gravity, Urine 1.005 1.005 - 1.030   pH 7.0 5.0 - 8.0   Glucose, UA NEGATIVE NEGATIVE mg/dL   Hgb urine dipstick NEGATIVE NEGATIVE   Bilirubin Urine NEGATIVE NEGATIVE   Ketones, ur NEGATIVE NEGATIVE mg/dL   Protein, ur NEGATIVE NEGATIVE mg/dL   Nitrite NEGATIVE NEGATIVE   Leukocytes,Ua TRACE (A) NEGATIVE   RBC / HPF 0-5 0 - 5 RBC/hpf   WBC, UA 0-5 0 - 5 WBC/hpf   Bacteria, UA RARE (A) NONE SEEN   Squamous Epithelial / LPF 6-10 0 - 5   Mucus PRESENT   Fetal fibronectin     Status: None   Collection Time: 05/02/20  9:09 PM  Result Value Ref Range   Fetal Fibronectin NEGATIVE NEGATIVE  Amnisure rupture of membrane (rom)not at [facility]     Status: None   Collection Time: 05/02/20  9:09 PM  Result Value Ref Range   Amnisure ROM NEGATIVE    MR LUMBAR SPINE WO CONTRAST  Result Date: 04/11/2020 CLINICAL DATA:  Lower extremity weakness and sensory disturbance, rule out spinal hematoma. EXAM: MRI LUMBAR SPINE WITHOUT CONTRAST TECHNIQUE: Multiplanar, multisequence MR imaging of  the lumbar spine was performed. No intravenous contrast was administered. COMPARISON:  04/05/2020 CT abdomen pelvis. FINDINGS: Segmentation:  Standard. Alignment:  Normal. Vertebrae: Normal bone marrow signal intensity. No focal osseous lesions. No bone marrow edema. Conus medullaris and cauda equina: Conus extends to the L1 level. Conus and cauda equina appear normal. No evidence of hemorrhage within the thecal sac. Disc levels: Disc spaces are preserved. L1-2: Negative L2-3: Negative L3-4: Negative L4-5: Negative L5-S1: Negative Paraspinal and other soft tissues: There is posterior body wall edema along the midline spanning the T12-L5 levels. No focal fluid collection identified. Redemonstration of moderate to severe right hydroureteronephrosis. IMPRESSION: No evidence of hematoma or intrathecal hemorrhage. Midline posterior body wall edema. No significant spinal canal or neural foraminal narrowing. Grossly unchanged moderate to severe right hydroureteronephrosis. These results were called by telephone at the time of interpretation on 04/11/2020 at 2:02 pm to provider [facility] , who verbally acknowledged these results. Electronically Signed   By: Stana Bunting M.D.   On: 04/11/2020 14:05   CT ABDOMEN PELVIS W CONTRAST  Result Date: 04/05/2020 CLINICAL DATA:  Right lower quadrant pain EXAM: CT ABDOMEN AND PELVIS WITH CONTRAST TECHNIQUE: Multidetector CT imaging of the abdomen and pelvis was performed using the standard protocol following bolus administration of intravenous contrast. CONTRAST:  OMNIPAQUE IOHEXOL 300 MG/ML  SOLN COMPARISON:  05/04/2016 FINDINGS: Lower chest: Minimal left base atelectasis.  No effusions. Hepatobiliary: No focal hepatic abnormality. Gallbladder unremarkable. Pancreas: No focal abnormality or ductal dilatation. Spleen: No focal abnormality.  Normal size. Adrenals/Urinary Tract: Punctate 2 mm right UVJ stone with severe right hydronephrosis. 2-3 mm nonobstructing stone  in the midpole of the right kidney. No stones or hydronephrosis on the left. Delayed excretion of contrast from the right kidney due to obstruction. Urinary bladder and adrenal glands unremarkable. Stomach/Bowel: Stomach, large and small bowel grossly unremarkable. Moderate stool in the colon. Vascular/Lymphatic: No evidence of aneurysm or adenopathy. Reproductive: Intrauterine gestation noted which appears of advanced gestational age. Anterior placenta. Cephalic presentation. Other: No free fluid or free air. Musculoskeletal: No acute bony abnormality. IMPRESSION:  2 mm right UVJ stone with moderate to severe right hydronephrosis. Right nephrolithiasis. Intrauterine gestation which appears to be of advanced gestational age. Moderate stool throughout the colon. Electronically Signed   By: Charlett Nose M.D.   On: 04/05/2020 23:21   DG Retrograde Pyelogram  Result Date: 04/09/2020 CLINICAL DATA:  Ureteroscopy, stone extraction, intraoperative examination. EXAM: DG C-ARM 1-60 MIN; RETROGRADE PYELOGRAM CONTRAST:  Not indicated, refer to operative note. FLUOROSCOPY TIME:  Fluoroscopy Time:  27 seconds Radiation Exposure Index (if provided by the fluoroscopic device): 5.05 mGy Number of Acquired Spot Images: 4 COMPARISON:  CT 04/05/2020 FINDINGS: Four fluoroscopic intraoperative radiographs demonstrate retrograde cannulation of the right ureter and right pyelogram. No intraluminal filling defect is seen within the visualized distal right ureter on the presented image in keeping with interval extraction of the calculus noted on prior CT examination. Subsequent images demonstrate a ureteral scope advanced into the right pelvocaliceal system. Mild right hydronephrosis is present, slightly improved since prior examination. Final 2 images demonstrate placement of a right ureteral stent in expected position IMPRESSION: Intraoperative retrograde pyelogram as described above. Electronically Signed   By: Helyn Numbers MD   On:  04/09/2020 17:34   US RENAL  Result Date: 04/25/2020 CLINICAL DATA:  Bilateral flank pain. EXAM: RENAL / URINARY TRACT ULTRASOUND COMPLETE COMPARISON:  None. FINDINGS: Right Kidney: Renal measurements: 11.3 cm x 5.0 cm x 5.6 cm = volume: 164.8 mL. Echogenicity within normal limits. No mass visualized. There is mild right-sided hydronephrosis. Left Kidney: Renal measurements: 11.3 cm x 4.9 cm x 4.7 cm = volume: 137 mL. Echogenicity within normal limits. No mass or hydronephrosis visualized. Bladder: A right-sided endo ureteral stent is noted. A left ureteral jet is visualized. Other: None. IMPRESSION: Mild right-sided hydronephrosis. Electronically Signed   By: Aram Candela M.D.   On: 04/25/2020 23:56   DG C-Arm 1-60 Min  Result Date: 04/09/2020 CLINICAL DATA:  Ureteroscopy, stone extraction, intraoperative examination. EXAM: DG C-ARM 1-60 MIN; RETROGRADE PYELOGRAM CONTRAST:  Not indicated, refer to operative note. FLUOROSCOPY TIME:  Fluoroscopy Time:  27 seconds Radiation Exposure Index (if provided by the fluoroscopic device): 5.05 mGy Number of Acquired Spot Images: 4 COMPARISON:  CT 04/05/2020 FINDINGS: Four fluoroscopic intraoperative radiographs demonstrate retrograde cannulation of the right ureter and right pyelogram. No intraluminal filling defect is seen within the visualized distal right ureter on the presented image in keeping with interval extraction of the calculus noted on prior CT examination. Subsequent images demonstrate a ureteral scope advanced into the right pelvocaliceal system. Mild right hydronephrosis is present, slightly improved since prior examination. Final 2 images demonstrate placement of a right ureteral stent in expected position IMPRESSION: Intraoperative retrograde pyelogram as described above. Electronically Signed   By: Helyn Numbers MD   On: 04/09/2020 17:34    MAU Course  Procedures  MDM -suspect bladder spasm, r/o PTL -no contractions palpated at  bedside -UA: straw/trace leuks/rare bacteria, urine not sent for culture, pt already on Keflex from urology -CE: Dilation: Closed Effacement (%): Thick Cervical Position: Posterior Exam by:: Donia Ast, NP -fFN: negative -AmniSure: negative -WetPrep/GC/CT collected 04/27/2020 - negative -EFM: reactive       -baseline: 145/140/130       -variability: moderate       -accels: present, 15x15       -decels: absent       -TOCO: irritability -consulted with urology for possible bladder spams, per urology, given hyoscyamine as trial and have patient f/u with urology on Tuesday  morning (confirmed with Dr. Jolayne Pantheronstant OK to give hyoscyamine in pregnancy) -hyosciamine given, pain resolved -pt discharged to home in stable condition  Orders Placed This Encounter  Procedures  . Urinalysis, Routine w reflex microscopic Urine, Clean Catch    Standing Status:   Standing    Number of Occurrences:   1  . Fetal fibronectin    Standing Status:   Standing    Number of Occurrences:   1  . Am[facility]    Standing Status:   Standing    Number of Occurrences:   1  . Discharge patient    Order Specific Question:   Discharge disposition    Answer:   01-Home or Self Care [1]    Order Specific Question:   Discharge patient date    Answer:   05/02/2020   Meds ordered this encounter  Medications  . DISCONTD: hyoscyamine (LEVSIN) tablet 0.125 mg  . hyoscyamine (LEVSIN SL) SL tablet 0.125 mg  . hyoscyamine (LEVSIN SL) SL tablet 0.125 mg  . hyoscyamine (LEVSIN SL) 0.125 MG SL tablet    Sig: Place 1 tablet (0.125 mg total) under the tongue every 4 (four) hours as needed.    Dispense:  30 tablet    Refill:  0    Order Specific Question:   Supervising Provider    Answer:   CONSTANT, PEGGY [[id number]]    Assessment and Plan   1. Bladder spasm   2. [redacted] weeks gestation of pregnancy   3. NST (non-stress test) reactive     Allergies as of 05/02/2020   No Known Allergies      Medication List    TAKE these medications   acetaminophen 500 MG tablet Commonly known as: TYLENOL Take 2 tablets (1,000 mg total) by mouth every 8 (eight) hours.   cyclobenzaprine 10 MG tablet Commonly known as: FLEXERIL Take 1 tablet (10 mg total) by mouth 2 (two) times daily as needed for muscle spasms.   HYDROmorphone 2 MG tablet Commonly known as: DILAUDID Take 1 tablet (2 mg total) by mouth every 3 (three) hours as needed for severe pain.   hyoscyamine 0.125 MG SL tablet Commonly known as: LEVSIN SL Place 1 tablet (0.125 mg total) under the tongue every 4 (four) hours as needed.   levothyroxine 200 MCG tablet Commonly known as: SYNTHROID Take 200 mcg by mouth daily before breakfast.   ondansetron 4 MG tablet Commonly known as: ZOFRAN Take 4 mg by mouth every 8 (eight) hours as needed for nausea or vomiting.   prenatal multivitamin Tabs tablet Take 1 tablet by mouth daily at 12 noon.   promethazine 25 MG tablet Commonly known as: PHENERGAN Take 1 tablet (25 mg total) by mouth every 6 (six) hours as needed for nausea or vomiting.       -discussed anti-cholinergic effects with hyoscyamine and hydrocodone and Flexeril and encouraged not to take additional medications if not needed for pain control -RX hyoscyamine for use through Tuesday -pt to call urology on Tuesday AM for appt -return MAU precautions given -pt discharged to home in stable condition  Joni Reiningicole E Xayne Brumbaugh 05/02/2020, 11:44 PM

## 2020-05-04 DIAGNOSIS — R8271 Bacteriuria: Secondary | ICD-10-CM | POA: Diagnosis not present

## 2020-05-04 DIAGNOSIS — Z87442 Personal history of urinary calculi: Secondary | ICD-10-CM | POA: Diagnosis not present

## 2020-05-06 ENCOUNTER — Inpatient Hospital Stay (HOSPITAL_COMMUNITY)
Admission: AD | Admit: 2020-05-06 | Discharge: 2020-05-06 | Disposition: A | Discharge status: Home / Self Care | Payer: Medicaid Other | Attending: Obstetrics and Gynecology | Admitting: Obstetrics and Gynecology

## 2020-05-06 ENCOUNTER — Encounter (HOSPITAL_COMMUNITY): Payer: Self-pay | Admitting: Obstetrics and Gynecology

## 2020-05-06 ENCOUNTER — Other Ambulatory Visit: Payer: Self-pay

## 2020-05-06 DIAGNOSIS — Z3A33 33 weeks gestation of pregnancy: Secondary | ICD-10-CM | POA: Diagnosis not present

## 2020-05-06 DIAGNOSIS — Z87442 Personal history of urinary calculi: Secondary | ICD-10-CM | POA: Diagnosis not present

## 2020-05-06 DIAGNOSIS — R519 Headache, unspecified: Secondary | ICD-10-CM | POA: Insufficient documentation

## 2020-05-06 DIAGNOSIS — Z79890 Hormone replacement therapy (postmenopausal): Secondary | ICD-10-CM | POA: Insufficient documentation

## 2020-05-06 DIAGNOSIS — E039 Hypothyroidism, unspecified: Secondary | ICD-10-CM | POA: Insufficient documentation

## 2020-05-06 DIAGNOSIS — O26893 Other specified pregnancy related conditions, third trimester: Secondary | ICD-10-CM | POA: Insufficient documentation

## 2020-05-06 DIAGNOSIS — R42 Dizziness and giddiness: Secondary | ICD-10-CM | POA: Diagnosis not present

## 2020-05-06 DIAGNOSIS — Z3689 Encounter for other specified antenatal screening: Secondary | ICD-10-CM

## 2020-05-06 LAB — CBC
HCT: 29.8 % — ABNORMAL LOW (ref 36.0–46.0)
Hemoglobin: 9.8 g/dL — ABNORMAL LOW (ref 12.0–15.0)
MCH: 30.2 pg (ref 26.0–34.0)
MCHC: 32.9 g/dL (ref 30.0–36.0)
MCV: 92 fL (ref 80.0–100.0)
Platelets: 189 10*3/uL (ref 150–400)
RBC: 3.24 MIL/uL — ABNORMAL LOW (ref 3.87–5.11)
RDW: 13.2 % (ref 11.5–15.5)
WBC: 6.5 10*3/uL (ref 4.0–10.5)
nRBC: 0 % (ref 0.0–0.2)

## 2020-05-06 LAB — URINALYSIS, ROUTINE W REFLEX MICROSCOPIC
Bilirubin Urine: NEGATIVE
Glucose, UA: NEGATIVE mg/dL
Hgb urine dipstick: NEGATIVE
Ketones, ur: NEGATIVE mg/dL
Nitrite: NEGATIVE
Protein, ur: NEGATIVE mg/dL
Specific Gravity, Urine: 1.016 (ref 1.005–1.030)
pH: 6 (ref 5.0–8.0)

## 2020-05-06 LAB — COMPREHENSIVE METABOLIC PANEL
ALT: 10 U/L (ref 0–44)
AST: 16 U/L (ref 15–41)
Albumin: 3 g/dL — ABNORMAL LOW (ref 3.5–5.0)
Alkaline Phosphatase: 58 U/L (ref 38–126)
Anion gap: 9 (ref 5–15)
BUN: 9 mg/dL (ref 6–20)
CO2: 22 mmol/L (ref 22–32)
Calcium: 9 mg/dL (ref 8.9–10.3)
Chloride: 107 mmol/L (ref 98–111)
Creatinine, Ser: 0.47 mg/dL (ref 0.44–1.00)
GFR calc Af Amer: >60 mL/min (ref >60)
GFR calc non Af Amer: >60 mL/min (ref >60)
Glucose, Bld: 83 mg/dL (ref 70–99)
Potassium: 3.8 mmol/L (ref 3.5–5.1)
Sodium: 138 mmol/L (ref 135–145)
Total Bilirubin: 0.3 mg/dL (ref 0.3–1.2)
Total Protein: 6.1 g/dL — ABNORMAL LOW (ref 6.5–8.1)

## 2020-05-06 MED ORDER — DEXAMETHASONE SODIUM PHOSPHATE 10 MG/ML IJ SOLN
10.0000 mg | Freq: Once | INTRAMUSCULAR | Status: AC
  Administered 2020-05-06: 10 mg via INTRAVENOUS
  Filled 2020-05-06: qty 1

## 2020-05-06 MED ORDER — METOCLOPRAMIDE HCL 5 MG/ML IJ SOLN
10.0000 mg | Freq: Once | INTRAMUSCULAR | Status: AC
  Administered 2020-05-06: 10 mg via INTRAVENOUS
  Filled 2020-05-06: qty 2

## 2020-05-06 MED ORDER — MECLIZINE HCL 12.5 MG PO TABS: 12.5000 mg | ORAL_TABLET | Freq: Three times a day (TID) | ORAL | 0 refills | Status: DC | PRN

## 2020-05-06 MED ORDER — DIPHENHYDRAMINE HCL 50 MG/ML IJ SOLN
25.0000 mg | Freq: Once | INTRAMUSCULAR | Status: AC
  Administered 2020-05-06: 25 mg via INTRAVENOUS
  Filled 2020-05-06: qty 1

## 2020-05-06 MED ORDER — LACTATED RINGERS IV BOLUS
1000.0000 mL | Freq: Once | INTRAVENOUS | Status: AC
  Administered 2020-05-06: 1000 mL via INTRAVENOUS

## 2020-05-06 NOTE — MAU Provider Note (Signed)
History     CSN: [id number]  Arrival date and time: 05/06/20 2105   First Provider Initiated Contact with Patient 05/06/20 2150      Chief Complaint  Patient presents with  . Headache    migrane over a week   Jenny Barnes is a 24 y.o. G2P1 at [redacted]w[redacted]d who presents to MAU with complaints of headache and dizziness. Patient reports that HA has been occurring for a little over a week. Describes as frontal HA, rates 8/10- has taken Tylenol every 8 hours for HA without relief. Patient reports waking up this morning with severe dizziness. Patient reports having random dizzy spells throughout the day, even while she was driving this afternoon. Patient denies any pregnancy related complaints, denies vaginal discharge, bleeding, contractions, LOF. +FM.    OB History    Gravida  2   Para  1   Term  1   Preterm      AB      Living  1     SAB      TAB      Ectopic      Multiple  0   Live Births  1           Past Medical History:  Diagnosis Date  . Hypothyroidism   . Kidney stone   . Thyroid disease     Past Surgical History:  Procedure Laterality Date  . CYSTOSCOPY W/ URETERAL STENT REMOVAL    . CYSTOSCOPY WITH RETROGRADE PYELOGRAM, URETEROSCOPY AND STENT PLACEMENT Right 04/09/2020   Procedure: URETEROSCOPY, BASKETING OF STONE & DOUBLE J. STENT PLACEMENT;  Surgeon: Sebastian Ache, MD;  Location: [facility] OR;  Service: Urology;  Laterality: Right;  . WISDOM TOOTH EXTRACTION      Family History  Problem Relation Age of Onset  . Kidney Stones Mother   . Kidney Stones Sister     Social History   Tobacco Use  . Smoking status: Never Smoker  . Smokeless tobacco: Never Used  Vaping Use  . Vaping Use: Never used  Substance Use Topics  . Alcohol use: No  . Drug use: Never    Allergies: No Known Allergies  Medications Prior to Admission  Medication Sig Dispense Refill Last Dose  . acetaminophen (TYLENOL) 500 MG tablet Take 2 tablets (1,000 mg total) by  mouth every 8 (eight) hours. 30 tablet 0 05/06/2020 at Unknown time  . levothyroxine (SYNTHROID) 200 MCG tablet Take 200 mcg by mouth daily before breakfast.   05/06/2020 at Unknown time  . ondansetron (ZOFRAN) 4 MG tablet Take 4 mg by mouth every 8 (eight) hours as needed for nausea or vomiting.   05/06/2020 at Unknown time  . Prenatal Vit-Fe Fumarate-FA (PRENATAL MULTIVITAMIN) TABS tablet Take 1 tablet by mouth daily at 12 noon.   05/06/2020 at Unknown time  . cyclobenzaprine (FLEXERIL) 10 MG tablet Take 1 tablet (10 mg total) by mouth 2 (two) times daily as needed for muscle spasms. 20 tablet 0   . HYDROmorphone (DILAUDID) 2 MG tablet Take 1 tablet (2 mg total) by mouth every 3 (three) hours as needed for severe pain. 30 tablet 0   . hyoscyamine (LEVSIN SL) 0.125 MG SL tablet Place 1 tablet (0.125 mg total) under the tongue every 4 (four) hours as needed. 30 tablet 0   . promethazine (PHENERGAN) 25 MG tablet Take 1 tablet (25 mg total) by mouth every 6 (six) hours as needed for nausea or vomiting. 30 tablet 0  Review of Systems  Constitutional: Negative.   Respiratory: Negative.   Cardiovascular: Negative.   Gastrointestinal: Negative.   Genitourinary: Negative.   Musculoskeletal: Negative.   Neurological: Positive for dizziness and headaches.  Psychiatric/Behavioral: Negative.    Physical Exam   Blood pressure 104/64, pulse 86, resp. rate 15, height 5\' 4"  (1.626 m), SpO2 98 %, unknown if currently breastfeeding.  Physical Exam Vitals and nursing note reviewed.  HENT:     Head: Normocephalic.  Cardiovascular:     Rate and Rhythm: Normal rate and regular rhythm.  Pulmonary:     Effort: Pulmonary effort is normal. No respiratory distress.     Breath sounds: Normal breath sounds. No wheezing.  Abdominal:     Palpations: Abdomen is soft.     Comments: Gravid appropriate for gestational age  Skin:    General: Skin is warm and dry.  Neurological:     Mental Status: She is alert and  oriented to person, place, and time.  Psychiatric:        Mood and Affect: Mood normal.        Speech: Speech normal.        Behavior: Behavior normal.    Fetal monitoring:  135/moderate/+accels/no decelerations  1 UC while on monitor   MAU Course  Procedures  MDM HA cocktail  CBC and CMP ordered   Labs reviewed:  Results for orders placed or performed during the hospital encounter of 05/06/20 (from the past 24 hour(s))  Urinalysis, Routine w reflex microscopic Urine, Clean Catch     Status: Abnormal   Collection Time: 05/06/20  9:17 PM  Result Value Ref Range   Color, Urine YELLOW YELLOW   APPearance CLOUDY (A) CLEAR   Specific Gravity, Urine 1.016 1.005 - 1.030   pH 6.0 5.0 - 8.0   Glucose, UA NEGATIVE NEGATIVE mg/dL   Hgb urine dipstick NEGATIVE NEGATIVE   Bilirubin Urine NEGATIVE NEGATIVE   Ketones, ur NEGATIVE NEGATIVE mg/dL   Protein, ur NEGATIVE NEGATIVE mg/dL   Nitrite NEGATIVE NEGATIVE   Leukocytes,Ua MODERATE (A) NEGATIVE   RBC / HPF 0-5 0 - 5 RBC/hpf   WBC, UA 11-20 0 - 5 WBC/hpf   Bacteria, UA RARE (A) NONE SEEN   Squamous Epithelial / LPF 11-20 0 - 5   Mucus PRESENT   CBC     Status: Abnormal   Collection Time: 05/06/20 10:15 PM  Result Value Ref Range   WBC 6.5 4.0 - 10.5 K/uL   RBC 3.24 (L) 3.87 - 5.11 MIL/uL   Hemoglobin 9.8 (L) 12.0 - 15.0 g/dL   HCT 07/06/20 (L) 36 - 46 %   MCV 92.0 80.0 - 100.0 fL   MCH 30.2 26.0 - 34.0 pg   MCHC 32.9 30.0 - 36.0 g/dL   RDW 74.0 81.4 - 48.1 %   Platelets 189 150 - 400 K/uL   nRBC 0.0 0.0 - 0.2 %  Comprehensive metabolic panel     Status: Abnormal   Collection Time: 05/06/20 10:15 PM  Result Value Ref Range   Sodium 138 135 - 145 mmol/L   Potassium 3.8 3.5 - 5.1 mmol/L   Chloride 107 98 - 111 mmol/L   CO2 22 22 - 32 mmol/L   Glucose, Bld 83 70 - 99 mg/dL   BUN 9 6 - 20 mg/dL   Creatinine, Ser 07/06/20 0.44 - 1.00 mg/dL   Calcium 9.0 8.9 - 3.14 mg/dL   Total Protein 6.1 (L) 6.5 - 8.1 g/dL  Albumin 3.0 (L) 3.5  - 5.0 g/dL   AST 16 15 - 41 U/L   ALT 10 0 - 44 U/L   Alkaline Phosphatase 58 38 - 126 U/L   Total Bilirubin 0.3 0.3 - 1.2 mg/dL   GFR calc non Af Amer >60 >60 mL/min   GFR calc Af Amer >60 >60 mL/min   Anion gap 9 5 - 15    Reassessment after HA cocktail- patient reports frontal HA is resolved and occipital HA is down to 4/10.  Patient reports that she was able to sleep and rest after treatment.  Educated and discussed to increase iron supplementation to BID, patient verbalizes understanding. Discussed with patient about vertigo, vertigo often resolved on its own. Discussed use of Meclizine with dizzy spells- patient agrees with plan of care. Rx sent to pharmacy of choice. Urine culture pending   Discussed reasons to return to MAU. Follow up as scheduled in the office. Return to MAU as needed. Pt stable at time of discharge.   Assessment and Plan   1. Headache in pregnancy, antepartum, third trimester   2. Vertigo   3. [redacted] weeks gestation of pregnancy   4. NST (non-stress test) reactive    Discharge home Follow up as scheduled in the office for prenatal care Return to MAU as needed for reasons discussed and/or emergencies  Rx for Meclizine    Follow-up Information    Ob/Gyn, [facility] Follow up.   Why: Follow up as scheduled for prenatal care Contact information: [address] [place] [address] [phone]              Allergies as of 05/07/2020   No Known Allergies     Medication List    STOP taking these medications   HYDROmorphone 2 MG tablet Commonly known as: DILAUDID   hyoscyamine 0.125 MG SL tablet Commonly known as: LEVSIN SL     TAKE these medications   acetaminophen 500 MG tablet Commonly known as: TYLENOL Take 2 tablets (1,000 mg total) by mouth every 8 (eight) hours.   cyclobenzaprine 10 MG tablet Commonly known as: FLEXERIL Take 1 tablet (10 mg total) by mouth 2 (two) times daily as needed for muscle spasms.    levothyroxine 200 MCG tablet Commonly known as: SYNTHROID Take 200 mcg by mouth daily before breakfast.   meclizine 12.5 MG tablet Commonly known as: ANTIVERT Take 1 tablet (12.5 mg total) by mouth 3 (three) times daily as needed for dizziness.   ondansetron 4 MG tablet Commonly known as: ZOFRAN Take 4 mg by mouth every 8 (eight) hours as needed for nausea or vomiting.   prenatal multivitamin Tabs tablet Take 1 tablet by mouth daily at 12 noon.   promethazine 25 MG tablet Commonly known as: PHENERGAN Take 1 tablet (25 mg total) by mouth every 6 (six) hours as needed for nausea or vomiting.        Sharyon Cable CNM 05/06/2020, 11:58 PM

## 2020-05-06 NOTE — MAU Note (Signed)
Pt c/o of HA reporting as a migraine for over a week, pain rating 8/10.  Pt reports taking Tylnelol every 8 hours without relief of the HA. Pt reports dizziness with weakness in legs. Pt reports that the HA is the same at rest and with activity. Pt report some N/V with acid reflex. Pt denies LOf, bleeding, DFM, and ctx.

## 2020-05-07 LAB — CULTURE, OB URINE: Culture: NO GROWTH

## 2020-05-18 DIAGNOSIS — Z87442 Personal history of urinary calculi: Secondary | ICD-10-CM | POA: Diagnosis not present

## 2020-05-21 DIAGNOSIS — J029 Acute pharyngitis, unspecified: Secondary | ICD-10-CM | POA: Diagnosis not present

## 2020-05-21 DIAGNOSIS — Z20822 Contact with and (suspected) exposure to covid-19: Secondary | ICD-10-CM | POA: Diagnosis not present

## 2020-05-25 DIAGNOSIS — Z3483 Encounter for supervision of other normal pregnancy, third trimester: Secondary | ICD-10-CM | POA: Diagnosis not present

## 2020-05-25 DIAGNOSIS — Z3480 Encounter for supervision of other normal pregnancy, unspecified trimester: Secondary | ICD-10-CM | POA: Diagnosis not present

## 2020-05-25 LAB — OB RESULTS CONSOLE GBS: GBS: NEGATIVE

## 2020-05-25 LAB — OB RESULTS CONSOLE ABO/RH

## 2020-06-07 ENCOUNTER — Inpatient Hospital Stay (HOSPITAL_COMMUNITY)
Admission: AD | Admit: 2020-06-07 | Discharge: 2020-06-07 | Disposition: A | Discharge status: Home / Self Care | Payer: Medicaid Other | Attending: Obstetrics & Gynecology | Admitting: Obstetrics & Gynecology

## 2020-06-07 ENCOUNTER — Encounter (HOSPITAL_COMMUNITY): Payer: Self-pay | Admitting: Obstetrics & Gynecology

## 2020-06-07 ENCOUNTER — Other Ambulatory Visit: Payer: Self-pay

## 2020-06-07 DIAGNOSIS — O212 Late vomiting of pregnancy: Secondary | ICD-10-CM | POA: Insufficient documentation

## 2020-06-07 DIAGNOSIS — O26893 Other specified pregnancy related conditions, third trimester: Secondary | ICD-10-CM | POA: Insufficient documentation

## 2020-06-07 DIAGNOSIS — O219 Vomiting of pregnancy, unspecified: Secondary | ICD-10-CM

## 2020-06-07 DIAGNOSIS — Z3A38 38 weeks gestation of pregnancy: Secondary | ICD-10-CM | POA: Diagnosis not present

## 2020-06-07 DIAGNOSIS — M549 Dorsalgia, unspecified: Secondary | ICD-10-CM | POA: Diagnosis not present

## 2020-06-07 DIAGNOSIS — E039 Hypothyroidism, unspecified: Secondary | ICD-10-CM | POA: Diagnosis not present

## 2020-06-07 DIAGNOSIS — O471 False labor at or after 37 completed weeks of gestation: Secondary | ICD-10-CM

## 2020-06-07 DIAGNOSIS — Z79899 Other long term (current) drug therapy: Secondary | ICD-10-CM | POA: Insufficient documentation

## 2020-06-07 DIAGNOSIS — R0789 Other chest pain: Secondary | ICD-10-CM | POA: Insufficient documentation

## 2020-06-07 DIAGNOSIS — O99283 Endocrine, nutritional and metabolic diseases complicating pregnancy, third trimester: Secondary | ICD-10-CM | POA: Diagnosis not present

## 2020-06-07 DIAGNOSIS — O368130 Decreased fetal movements, third trimester, not applicable or unspecified: Secondary | ICD-10-CM | POA: Diagnosis not present

## 2020-06-07 LAB — CBC
HCT: 32.2 % — ABNORMAL LOW (ref 36.0–46.0)
Hemoglobin: 10.6 g/dL — ABNORMAL LOW (ref 12.0–15.0)
MCH: 30.3 pg (ref 26.0–34.0)
MCHC: 32.9 g/dL (ref 30.0–36.0)
MCV: 92 fL (ref 80.0–100.0)
Platelets: 191 10*3/uL (ref 150–400)
RBC: 3.5 MIL/uL — ABNORMAL LOW (ref 3.87–5.11)
RDW: 13.5 % (ref 11.5–15.5)
WBC: 8.2 10*3/uL (ref 4.0–10.5)
nRBC: 0 % (ref 0.0–0.2)

## 2020-06-07 LAB — COMPREHENSIVE METABOLIC PANEL
ALT: 11 U/L (ref 0–44)
AST: 13 U/L — ABNORMAL LOW (ref 15–41)
Albumin: 3.3 g/dL — ABNORMAL LOW (ref 3.5–5.0)
Alkaline Phosphatase: 65 U/L (ref 38–126)
Anion gap: 10 (ref 5–15)
BUN: 14 mg/dL (ref 6–20)
CO2: 23 mmol/L (ref 22–32)
Calcium: 9.5 mg/dL (ref 8.9–10.3)
Chloride: 102 mmol/L (ref 98–111)
Creatinine, Ser: 0.68 mg/dL (ref 0.44–1.00)
GFR, Estimated: >60 mL/min (ref >60)
Glucose, Bld: 76 mg/dL (ref 70–99)
Potassium: 3.8 mmol/L (ref 3.5–5.1)
Sodium: 135 mmol/L (ref 135–145)
Total Bilirubin: 0.7 mg/dL (ref 0.3–1.2)
Total Protein: 6.5 g/dL (ref 6.5–8.1)

## 2020-06-07 LAB — URINALYSIS, ROUTINE W REFLEX MICROSCOPIC
Bilirubin Urine: NEGATIVE
Glucose, UA: NEGATIVE mg/dL
Hgb urine dipstick: NEGATIVE
Ketones, ur: NEGATIVE mg/dL
Leukocytes,Ua: NEGATIVE
Nitrite: NEGATIVE
Protein, ur: NEGATIVE mg/dL
Specific Gravity, Urine: 1.006 (ref 1.005–1.030)
pH: 7 (ref 5.0–8.0)

## 2020-06-07 MED ORDER — PROCHLORPERAZINE MALEATE 10 MG PO TABS
10.0000 mg | ORAL_TABLET | Freq: Once | ORAL | Status: AC
  Administered 2020-06-07: 10 mg via ORAL
  Filled 2020-06-07: qty 1

## 2020-06-07 MED ORDER — LACTATED RINGERS IV BOLUS
1000.0000 mL | Freq: Once | INTRAVENOUS | Status: AC
  Administered 2020-06-07: 1000 mL via INTRAVENOUS

## 2020-06-07 NOTE — MAU Provider Note (Signed)
[name] is a G2P1001 female at [redacted]w[redacted]d gestation  Report received and care assumed from Candelaria Celeste, MD @ 2000. CNM unable to take over documentation from Candelaria Celeste, MD. See documentation of patient history, assessment and MDM from Candelaria Celeste, MD's MAU Provider note.  Reassessment @ 2120: N/V improved and ready to go home  Assessment/Plan:  Nausea/vomiting in pregnancy  - Information provided on HG - Continue with anti-emetics at home - F/U with [facility] for change in Rx  False labor after 37 completed weeks of gestation  - Information provided on [facility] ctxs  - Discharge patient - Keep scheduled appt with [facility] - Patient verbalized an understanding of the plan of care and agrees.    Raelyn Mora, CNM  06/07/2020 8:07 PM

## 2020-06-07 NOTE — MAU Note (Signed)
Presents with c/o SOB and chest pressure that began Saturday evening.  Reports feels like can't breathe with activity but states it's "hard to breathe" if sitting.  Also c/o N/V, been nauseated since last Thursday and began vomiting Friday.  Reports she's taken Zofran & Phenergan and neither has helped.  Reports last emesis @ 1500 this afternoon.  Also c/o sharp shooting pain in lower back.  Denies VB or LOF.  Reports +FM.

## 2020-06-07 NOTE — Progress Notes (Signed)
SVE deferred per provider-pt remains unaware of contractions. No vaginal bleeding, bloody show, vaginal pressure or urge to push.

## 2020-06-07 NOTE — MAU Provider Note (Signed)
History     CSN: [id number]  Arrival date and time: 06/07/20 1817   None     Chief Complaint  Patient presents with  . Nausea  . Emesis  . Back Pain  . Decreased Fetal Movement  . Chest Pressure   HPI This is a 24yo G2P1001 at [redacted]w[redacted]d who presents with nausea and vomiting that started on Friday. Symptoms have been increasing and not improving with zofran. Emesis described as stomach contents. She either vomits when she eats or a short time afterwards. No fevers, chills. She does have some SOB and chest pressure when she is walking around, but comfortable at rest. No cough, wheezing, etc.  Denies leaking fluid, decreased fetal movement, contractions.  OB History    Gravida  2   Para  1   Term  1   Preterm      AB      Living  1     SAB      TAB      Ectopic      Multiple  0   Live Births  1           Past Medical History:  Diagnosis Date  . Hypothyroidism   . Kidney stone    August 2021  . Thyroid disease     Past Surgical History:  Procedure Laterality Date  . CYSTOSCOPY W/ URETERAL STENT REMOVAL    . CYSTOSCOPY WITH RETROGRADE PYELOGRAM, URETEROSCOPY AND STENT PLACEMENT Right 04/09/2020   Procedure: URETEROSCOPY, BASKETING OF STONE & DOUBLE J. STENT PLACEMENT;  Surgeon: Sebastian Ache, MD;  Location: [facility] OR;  Service: Urology;  Laterality: Right;  . WISDOM TOOTH EXTRACTION      Family History  Problem Relation Age of Onset  . Kidney Stones Mother   . Kidney Stones Sister     Social History   Tobacco Use  . Smoking status: Never Smoker  . Smokeless tobacco: Never Used  Vaping Use  . Vaping Use: Never used  Substance Use Topics  . Alcohol use: No  . Drug use: Never    Allergies: No Known Allergies  Medications Prior to Admission  Medication Sig Dispense Refill Last Dose  . levothyroxine (SYNTHROID) 200 MCG tablet Take 225 mcg by mouth daily before breakfast.    06/07/2020 at Unknown time  . ondansetron (ZOFRAN) 4 MG tablet Take 4  mg by mouth every 8 (eight) hours as needed for nausea or vomiting.   06/07/2020 at Unknown time  . Prenatal Vit-Fe Fumarate-FA (PRENATAL MULTIVITAMIN) TABS tablet Take 1 tablet by mouth daily at 12 noon.   06/07/2020 at Unknown time  . acetaminophen (TYLENOL) 500 MG tablet Take 2 tablets (1,000 mg total) by mouth every 8 (eight) hours. 30 tablet 0   . cyclobenzaprine (FLEXERIL) 10 MG tablet Take 1 tablet (10 mg total) by mouth 2 (two) times daily as needed for muscle spasms. 20 tablet 0   . meclizine (ANTIVERT) 12.5 MG tablet Take 1 tablet (12.5 mg total) by mouth 3 (three) times daily as needed for dizziness. 30 tablet 0   . promethazine (PHENERGAN) 25 MG tablet Take 1 tablet (25 mg total) by mouth every 6 (six) hours as needed for nausea or vomiting. 30 tablet 0     Review of Systems Physical Exam   Blood pressure 114/73, pulse 83, temperature 98.3 F (36.8 C), temperature source Oral, resp. rate 20, height 5\' 4"  (1.626 m), weight 62.5 kg, SpO2 99 %, unknown if currently breastfeeding.  Physical  Exam Vitals reviewed.  HENT:     Head: Normocephalic and atraumatic.  Cardiovascular:     Rate and Rhythm: Normal rate and regular rhythm.     Pulses: Normal pulses.     Heart sounds: Normal heart sounds.  Pulmonary:     Effort: Pulmonary effort is normal.     Breath sounds: Normal breath sounds.  Abdominal:     General: Abdomen is flat. There is no distension.     Palpations: Abdomen is soft. There is no mass.     Tenderness: There is no abdominal tenderness. There is no guarding or rebound.     Hernia: No hernia is present.  Skin:    General: Skin is warm and dry.     Capillary Refill: Capillary refill takes less than 2 seconds.  Psychiatric:        Mood and Affect: Mood normal.        Behavior: Behavior normal.        Thought Content: Thought content normal.        Judgment: Judgment normal.     Results for orders placed or performed during the hospital encounter of 06/07/20  (from the past 24 hour(s))  Comprehensive metabolic panel     Status: Abnormal   Collection Time: 06/07/20  7:08 PM  Result Value Ref Range   Sodium 135 135 - 145 mmol/L   Potassium 3.8 3.5 - 5.1 mmol/L   Chloride 102 98 - 111 mmol/L   CO2 23 22 - 32 mmol/L   Glucose, Bld 76 70 - 99 mg/dL   BUN 14 6 - 20 mg/dL   Creatinine, Ser 2.54 0.44 - 1.00 mg/dL   Calcium 9.5 8.9 - 27.0 mg/dL   Total Protein 6.5 6.5 - 8.1 g/dL   Albumin 3.3 (L) 3.5 - 5.0 g/dL   AST 13 (L) 15 - 41 U/L   ALT 11 0 - 44 U/L   Alkaline Phosphatase 65 38 - 126 U/L   Total Bilirubin 0.7 0.3 - 1.2 mg/dL   GFR, Estimated >62 >37 mL/min   Anion gap 10 5 - 15  CBC     Status: Abnormal   Collection Time: 06/07/20  7:08 PM  Result Value Ref Range   WBC 8.2 4.0 - 10.5 K/uL   RBC 3.50 (L) 3.87 - 5.11 MIL/uL   Hemoglobin 10.6 (L) 12.0 - 15.0 g/dL   HCT 62.8 (L) 36 - 46 %   MCV 92.0 80.0 - 100.0 fL   MCH 30.3 26.0 - 34.0 pg   MCHC 32.9 30.0 - 36.0 g/dL   RDW 31.5 17.6 - 16.0 %   Platelets 191 150 - 400 K/uL   nRBC 0.0 0.0 - 0.2 %  Urinalysis, Routine w reflex microscopic     Status: Abnormal   Collection Time: 06/07/20  7:17 PM  Result Value Ref Range   Color, Urine STRAW (A) YELLOW   APPearance CLEAR CLEAR   Specific Gravity, Urine 1.006 1.005 - 1.030   pH 7.0 5.0 - 8.0   Glucose, UA NEGATIVE NEGATIVE mg/dL   Hgb urine dipstick NEGATIVE NEGATIVE   Bilirubin Urine NEGATIVE NEGATIVE   Ketones, ur NEGATIVE NEGATIVE mg/dL   Protein, ur NEGATIVE NEGATIVE mg/dL   Nitrite NEGATIVE NEGATIVE   Leukocytes,Ua NEGATIVE NEGATIVE     MAU Course  Procedures  MDM 1L IVF  CMP, CBC  Care turned over to Denton Meek, CNM @ 2001.  Levie Heritage, DO   Assessment and Plan

## 2020-06-08 ENCOUNTER — Telehealth (HOSPITAL_COMMUNITY): Payer: Self-pay | Admitting: *Deleted

## 2020-06-08 ENCOUNTER — Other Ambulatory Visit: Payer: Self-pay | Admitting: Obstetrics and Gynecology

## 2020-06-08 NOTE — Telephone Encounter (Signed)
Preadmission screen  

## 2020-06-09 ENCOUNTER — Telehealth (HOSPITAL_COMMUNITY): Payer: Self-pay | Admitting: *Deleted

## 2020-06-11 ENCOUNTER — Encounter (HOSPITAL_COMMUNITY): Payer: Self-pay | Admitting: *Deleted

## 2020-06-11 NOTE — Telephone Encounter (Signed)
Preadmission screen  

## 2020-06-12 ENCOUNTER — Other Ambulatory Visit (HOSPITAL_COMMUNITY)
Admission: RE | Admit: 2020-06-12 | Discharge: 2020-06-12 | Disposition: A | Discharge status: Home / Self Care | Payer: Medicaid Other | Source: Ambulatory Visit | Attending: Obstetrics and Gynecology | Admitting: Obstetrics and Gynecology

## 2020-06-12 DIAGNOSIS — Z20822 Contact with and (suspected) exposure to covid-19: Secondary | ICD-10-CM | POA: Insufficient documentation

## 2020-06-12 DIAGNOSIS — Z01812 Encounter for preprocedural laboratory examination: Secondary | ICD-10-CM | POA: Insufficient documentation

## 2020-06-12 LAB — SARS CORONAVIRUS 2 (TAT 6-24 HRS): SARS Coronavirus 2: NEGATIVE

## 2020-06-14 ENCOUNTER — Other Ambulatory Visit: Payer: Self-pay

## 2020-06-14 ENCOUNTER — Inpatient Hospital Stay (HOSPITAL_COMMUNITY): Payer: Medicaid Other | Admitting: Anesthesiology

## 2020-06-14 ENCOUNTER — Encounter (HOSPITAL_COMMUNITY): Payer: Self-pay | Admitting: Obstetrics & Gynecology

## 2020-06-14 ENCOUNTER — Inpatient Hospital Stay (HOSPITAL_COMMUNITY)
Admission: AD | Admit: 2020-06-14 | Discharge: 2020-06-15 | DRG: 807 | Disposition: A | Discharge status: Home / Self Care | Payer: Medicaid Other | Attending: Obstetrics & Gynecology | Admitting: Obstetrics & Gynecology

## 2020-06-14 DIAGNOSIS — O9902 Anemia complicating childbirth: Secondary | ICD-10-CM | POA: Diagnosis not present

## 2020-06-14 DIAGNOSIS — Z23 Encounter for immunization: Secondary | ICD-10-CM | POA: Diagnosis not present

## 2020-06-14 DIAGNOSIS — Z3A39 39 weeks gestation of pregnancy: Secondary | ICD-10-CM

## 2020-06-14 DIAGNOSIS — O99284 Endocrine, nutritional and metabolic diseases complicating childbirth: Principal | ICD-10-CM | POA: Diagnosis present

## 2020-06-14 DIAGNOSIS — E039 Hypothyroidism, unspecified: Secondary | ICD-10-CM | POA: Diagnosis present

## 2020-06-14 DIAGNOSIS — O26893 Other specified pregnancy related conditions, third trimester: Secondary | ICD-10-CM | POA: Diagnosis not present

## 2020-06-14 DIAGNOSIS — D649 Anemia, unspecified: Secondary | ICD-10-CM | POA: Diagnosis not present

## 2020-06-14 DIAGNOSIS — Z87442 Personal history of urinary calculi: Secondary | ICD-10-CM | POA: Diagnosis not present

## 2020-06-14 DIAGNOSIS — Z3490 Encounter for supervision of normal pregnancy, unspecified, unspecified trimester: Secondary | ICD-10-CM | POA: Diagnosis present

## 2020-06-14 LAB — CBC
HCT: 34.5 % — ABNORMAL LOW (ref 36.0–46.0)
Hemoglobin: 11.3 g/dL — ABNORMAL LOW (ref 12.0–15.0)
MCH: 30.6 pg (ref 26.0–34.0)
MCHC: 32.8 g/dL (ref 30.0–36.0)
MCV: 93.5 fL (ref 80.0–100.0)
Platelets: 192 10*3/uL (ref 150–400)
RBC: 3.69 MIL/uL — ABNORMAL LOW (ref 3.87–5.11)
RDW: 13.7 % (ref 11.5–15.5)
WBC: 8.6 10*3/uL (ref 4.0–10.5)
nRBC: 0 % (ref 0.0–0.2)

## 2020-06-14 MED ORDER — LACTATED RINGERS IV SOLN: 500.0000 mL | INTRAVENOUS | Status: DC | PRN

## 2020-06-14 MED ORDER — LEVOTHYROXINE SODIUM 75 MCG PO TABS
175.0000 ug | ORAL_TABLET | Freq: Every day | ORAL | Status: DC
  Administered 2020-06-15: 175 ug via ORAL
  Filled 2020-06-14: qty 1

## 2020-06-14 MED ORDER — ONDANSETRON HCL 4 MG PO TABS
4.0000 mg | ORAL_TABLET | ORAL | Status: DC | PRN
  Administered 2020-06-14: 4 mg via ORAL
  Filled 2020-06-14: qty 1

## 2020-06-14 MED ORDER — ONDANSETRON HCL 4 MG/2ML IJ SOLN: 4.0000 mg | INTRAMUSCULAR | Status: DC | PRN

## 2020-06-14 MED ORDER — EPHEDRINE 5 MG/ML INJ: 10.0000 mg | INTRAVENOUS | Status: DC | PRN

## 2020-06-14 MED ORDER — WITCH HAZEL-GLYCERIN EX PADS: 1.0000 "application " | MEDICATED_PAD | CUTANEOUS | Status: DC | PRN

## 2020-06-14 MED ORDER — LIDOCAINE HCL (PF) 1 % IJ SOLN: 30.0000 mL | INTRAMUSCULAR | Status: DC | PRN

## 2020-06-14 MED ORDER — FENTANYL-BUPIVACAINE-NACL 0.5-0.125-0.9 MG/250ML-% EP SOLN
12.0000 mL/h | EPIDURAL | Status: DC | PRN
  Filled 2020-06-14: qty 250

## 2020-06-14 MED ORDER — SODIUM CHLORIDE 0.9 % IV SOLN: 250.0000 mL | INTRAVENOUS | Status: DC | PRN

## 2020-06-14 MED ORDER — FENTANYL CITRATE (PF) 100 MCG/2ML IJ SOLN
INTRAMUSCULAR | Status: DC | PRN
  Administered 2020-06-14: 100 ug via EPIDURAL

## 2020-06-14 MED ORDER — PHENYLEPHRINE 40 MCG/ML (10ML) SYRINGE FOR IV PUSH (FOR BLOOD PRESSURE SUPPORT)
80.0000 ug | PREFILLED_SYRINGE | INTRAVENOUS | Status: DC | PRN
  Filled 2020-06-14: qty 10

## 2020-06-14 MED ORDER — FENTANYL CITRATE (PF) 100 MCG/2ML IJ SOLN
INTRAMUSCULAR | Status: AC
  Filled 2020-06-14: qty 2

## 2020-06-14 MED ORDER — SENNOSIDES-DOCUSATE SODIUM 8.6-50 MG PO TABS
2.0000 | ORAL_TABLET | ORAL | Status: DC
  Administered 2020-06-14: 2 via ORAL
  Filled 2020-06-14: qty 2

## 2020-06-14 MED ORDER — BUPIVACAINE HCL (PF) 0.25 % IJ SOLN
INTRAMUSCULAR | Status: DC | PRN
  Administered 2020-06-14: 8 mL via EPIDURAL

## 2020-06-14 MED ORDER — SODIUM CHLORIDE 0.9% FLUSH: 3.0000 mL | Freq: Two times a day (BID) | INTRAVENOUS | Status: DC

## 2020-06-14 MED ORDER — OXYTOCIN-SODIUM CHLORIDE 30-0.9 UT/500ML-% IV SOLN
2.5000 [IU]/h | INTRAVENOUS | Status: DC
  Filled 2020-06-14: qty 500

## 2020-06-14 MED ORDER — TERBUTALINE SULFATE 1 MG/ML IJ SOLN: 0.2500 mg | Freq: Once | INTRAMUSCULAR | Status: DC | PRN

## 2020-06-14 MED ORDER — LEVOTHYROXINE SODIUM 75 MCG PO TABS
225.0000 ug | ORAL_TABLET | Freq: Every day | ORAL | Status: DC
  Administered 2020-06-14: 225 ug via ORAL
  Filled 2020-06-14: qty 1

## 2020-06-14 MED ORDER — PRENATAL MULTIVITAMIN CH
1.0000 | ORAL_TABLET | Freq: Every day | ORAL | Status: DC
  Administered 2020-06-15: 1 via ORAL
  Filled 2020-06-14: qty 1

## 2020-06-14 MED ORDER — ACETAMINOPHEN 325 MG PO TABS
650.0000 mg | ORAL_TABLET | ORAL | Status: DC | PRN
  Administered 2020-06-14 – 2020-06-15 (×2): 650 mg via ORAL
  Filled 2020-06-14 (×2): qty 2

## 2020-06-14 MED ORDER — LACTATED RINGERS IV SOLN: INTRAVENOUS | Status: DC

## 2020-06-14 MED ORDER — OXYCODONE-ACETAMINOPHEN 5-325 MG PO TABS: 1.0000 | ORAL_TABLET | ORAL | Status: DC | PRN

## 2020-06-14 MED ORDER — OXYTOCIN 10 UNIT/ML IJ SOLN
INTRAMUSCULAR | Status: AC
  Administered 2020-06-14: 10 [IU]
  Filled 2020-06-14: qty 1

## 2020-06-14 MED ORDER — DIPHENHYDRAMINE HCL 25 MG PO CAPS: 25.0000 mg | ORAL_CAPSULE | Freq: Four times a day (QID) | ORAL | Status: DC | PRN

## 2020-06-14 MED ORDER — FLEET ENEMA 7-19 GM/118ML RE ENEM: 1.0000 | ENEMA | Freq: Every day | RECTAL | Status: DC | PRN

## 2020-06-14 MED ORDER — FLEET ENEMA 7-19 GM/118ML RE ENEM: 1.0000 | ENEMA | RECTAL | Status: DC | PRN

## 2020-06-14 MED ORDER — ONDANSETRON HCL 4 MG/2ML IJ SOLN: 4.0000 mg | Freq: Four times a day (QID) | INTRAMUSCULAR | Status: DC | PRN

## 2020-06-14 MED ORDER — OXYCODONE HCL 5 MG PO TABS: 10.0000 mg | ORAL_TABLET | ORAL | Status: DC | PRN

## 2020-06-14 MED ORDER — OXYTOCIN BOLUS FROM INFUSION: 333.0000 mL | Freq: Once | INTRAVENOUS | Status: DC

## 2020-06-14 MED ORDER — INFLUENZA VAC SPLIT QUAD 0.5 ML IM SUSY
0.5000 mL | PREFILLED_SYRINGE | INTRAMUSCULAR | Status: AC
  Administered 2020-06-15: 0.5 mL via INTRAMUSCULAR
  Filled 2020-06-14: qty 0.5

## 2020-06-14 MED ORDER — BENZOCAINE-MENTHOL 20-0.5 % EX AERO: 1.0000 "application " | INHALATION_SPRAY | CUTANEOUS | Status: DC | PRN

## 2020-06-14 MED ORDER — PHENYLEPHRINE 40 MCG/ML (10ML) SYRINGE FOR IV PUSH (FOR BLOOD PRESSURE SUPPORT): 80.0000 ug | PREFILLED_SYRINGE | INTRAVENOUS | Status: DC | PRN

## 2020-06-14 MED ORDER — LACTATED RINGERS IV SOLN: 500.0000 mL | Freq: Once | INTRAVENOUS | Status: DC

## 2020-06-14 MED ORDER — SODIUM CHLORIDE 0.9% FLUSH: 3.0000 mL | INTRAVENOUS | Status: DC | PRN

## 2020-06-14 MED ORDER — COCONUT OIL OIL: 1.0000 "application " | TOPICAL_OIL | Status: DC | PRN

## 2020-06-14 MED ORDER — IBUPROFEN 600 MG PO TABS
600.0000 mg | ORAL_TABLET | Freq: Four times a day (QID) | ORAL | Status: DC
  Administered 2020-06-14 – 2020-06-15 (×4): 600 mg via ORAL
  Filled 2020-06-14 (×4): qty 1

## 2020-06-14 MED ORDER — LIDOCAINE HCL (PF) 1 % IJ SOLN
INTRAMUSCULAR | Status: DC | PRN
  Administered 2020-06-14: 2 mL via EPIDURAL
  Administered 2020-06-14: 10 mL via EPIDURAL

## 2020-06-14 MED ORDER — MISOPROSTOL 25 MCG QUARTER TABLET: 25.0000 ug | ORAL_TABLET | ORAL | Status: DC | PRN

## 2020-06-14 MED ORDER — DIPHENHYDRAMINE HCL 50 MG/ML IJ SOLN: 12.5000 mg | INTRAMUSCULAR | Status: DC | PRN

## 2020-06-14 MED ORDER — BISACODYL 10 MG RE SUPP: 10.0000 mg | Freq: Every day | RECTAL | Status: DC | PRN

## 2020-06-14 MED ORDER — OXYTOCIN 10 UNIT/ML IJ SOLN: 10.0000 [IU] | Freq: Once | INTRAMUSCULAR | Status: DC

## 2020-06-14 MED ORDER — FENTANYL CITRATE (PF) 2500 MCG/50ML IJ SOLN
INTRAMUSCULAR | Status: DC | PRN
Start: 2020-06-14 — End: 2020-06-14
  Administered 2020-06-14: 12 mL/h via EPIDURAL

## 2020-06-14 MED ORDER — SIMETHICONE 80 MG PO CHEW: 80.0000 mg | CHEWABLE_TABLET | ORAL | Status: DC | PRN

## 2020-06-14 MED ORDER — OXYCODONE HCL 5 MG PO TABS
5.0000 mg | ORAL_TABLET | ORAL | Status: DC | PRN
  Administered 2020-06-15 (×2): 5 mg via ORAL
  Filled 2020-06-14 (×2): qty 1

## 2020-06-14 MED ORDER — ACETAMINOPHEN 325 MG PO TABS: 650.0000 mg | ORAL_TABLET | ORAL | Status: DC | PRN

## 2020-06-14 MED ORDER — SOD CITRATE-CITRIC ACID 500-334 MG/5ML PO SOLN: 30.0000 mL | ORAL | Status: DC | PRN

## 2020-06-14 MED ORDER — FENTANYL CITRATE (PF) 100 MCG/2ML IJ SOLN: 100.0000 ug | Freq: Once | INTRAMUSCULAR | Status: DC

## 2020-06-14 MED ORDER — OXYCODONE-ACETAMINOPHEN 5-325 MG PO TABS: 2.0000 | ORAL_TABLET | ORAL | Status: DC | PRN

## 2020-06-14 MED ORDER — DIBUCAINE (PERIANAL) 1 % EX OINT: 1.0000 "application " | TOPICAL_OINTMENT | CUTANEOUS | Status: DC | PRN

## 2020-06-14 NOTE — Plan of Care (Signed)
Pt demonstrated understanding 

## 2020-06-14 NOTE — H&P (Signed)
Jenny Barnes is a 24 y.o. female G2P1001 [redacted]w[redacted]d presenting for labor/rupture of membranes. She reports concern for LOF around 5am and contractions since 2am. Reports normal FM. Denies VB.  Pregnancy c/b: 1. Kidney stone in pregnancy: Stent placed and removed during pregnancy. Patient was hospitalized 8/9-8/18.  2. Hypothyroidism: increased during pregnancy, on synthroid QD.  OB History    Gravida  2   Para  1   Term  1   Preterm      AB      Living  1     SAB      TAB      Ectopic      Multiple  0   Live Births  1          Past Medical History:  Diagnosis Date  . Hypothyroidism   . Kidney stone    August 2021  . Thyroid disease    Past Surgical History:  Procedure Laterality Date  . CYSTOSCOPY W/ URETERAL STENT REMOVAL    . CYSTOSCOPY WITH RETROGRADE PYELOGRAM, URETEROSCOPY AND STENT PLACEMENT Right 04/09/2020   Procedure: URETEROSCOPY, BASKETING OF STONE & DOUBLE J. STENT PLACEMENT;  Surgeon: Sebastian Ache, MD;  Location: [facility] OR;  Service: Urology;  Laterality: Right;  . WISDOM TOOTH EXTRACTION     Family History: family history includes Kidney Stones in her mother and sister. Social History:  reports that she has never smoked. She has never used smokeless tobacco. She reports that she does not drink alcohol and does not use drugs.     Maternal Diabetes: No Genetic Screening: Normal - first trimester screen Maternal Ultrasounds/Referrals: Normal anatomy. Anterior placenta Fetal Ultrasounds or other Referrals:  None Maternal Substance Abuse:  No Significant Maternal Medications:  Synthroid Significant Maternal Lab Results:  Group B Strep negative Other Comments:  None  Review of Systems Per HPI Exam Physical Exam  Dilation: 6.5 Effacement (%): 90 Station: -1 Exam by:: Ginger Morris RN Blood pressure 113/71, pulse 96, temperature 98.1 F (36.7 C), SpO2 100 %, unknown if currently breastfeeding. NAD, resting comfortably, painful  with contractions Gravid abdomen Fetal testing: FHR 135, Cat 1. toco q2-76m Prenatal labs: ABO, Rh:  B/--/-- (09/28 0000) Antibody: NEG (08/09 1914) Rubella: Immune (04/06 0000) RPR: Nonreactive (04/06 0000)  HBsAg: Negative (04/06 0000)  HIV: Non-reactive (04/06 0000)  GBS: Negative/-- (09/28 0000)   Assessment/Plan: Jenny Barnes 24 y.o. G2P1001 at [redacted]w[redacted]d here with labor 1. Labor: On admit 6.5/90/-1, contracting spontaneously. Ruled out for ROM in MAU. Augment with pitocin/AROM prn 2. Hypothyroidism: increased during pregnancy, on synthroid QD. Prepregnancy dose 3. Vaccines: received tdap in pregnancy. Has not received Flu/COVID vaccines Yachet Mattson K Taam-Akelman 06/14/2020, 12:21 PM

## 2020-06-14 NOTE — Anesthesia Procedure Notes (Signed)
Epidural Patient location during procedure: OB Start time: 06/14/2020 12:56 PM End time: 06/14/2020 1:12 PM  Staffing Anesthesiologist: Lannie Fields, DO Performed: other anesthesia staff and anesthesiologist   Preanesthetic Checklist Completed: patient identified, IV checked, risks and benefits discussed, monitors and equipment checked, pre-op evaluation and timeout performed  Epidural Patient position: sitting Prep: DuraPrep and site prepped and draped Patient monitoring: continuous pulse ox, blood pressure, heart rate and cardiac monitor Approach: midline Location: L3-L4 Injection technique: LOR air  Needle:  Needle type: Tuohy  Needle gauge: 17 G Needle length: 9 cm Needle insertion depth: 4.5 cm Catheter type: closed end flexible Catheter size: 19 Gauge Catheter at skin depth: 10 cm Test dose: negative  Assessment Sensory level: T8 Events: blood not aspirated, injection not painful, no injection resistance, no paresthesia and negative IV test  Additional Notes DPE with 25G spinal needle to confirm epidural placement, clear CSFReason for block:procedure for pain

## 2020-06-14 NOTE — MAU Note (Signed)
.   Jenny Barnes is a 24 y.o. at [redacted]w[redacted]d here in MAU reporting: contractions that started around 2 am with LOF starting around 5. Denies any VB. +FM  Onset of complaint: 06/14/20 @ 0200 Pain score: 10  Vitals:   06/14/20 1135  BP: 113/71  Pulse: 96  Temp: 98.1 F (36.7 C)     FHT:145 Lab orders placed from triage:

## 2020-06-14 NOTE — Anesthesia Preprocedure Evaluation (Signed)
Anesthesia Evaluation  Patient identified by MRN, date of birth, ID band Patient awake    Reviewed: Allergy & Precautions, Patient's Chart, lab work & pertinent test results  Airway Mallampati: II  TM Distance: >3 FB Neck ROM: Full    Dental no notable dental hx.    Pulmonary neg pulmonary ROS,    Pulmonary exam normal breath sounds clear to auscultation       Cardiovascular negative cardio ROS Normal cardiovascular exam Rhythm:Regular Rate:Normal     Neuro/Psych negative neurological ROS  negative psych ROS   GI/Hepatic negative GI ROS, Neg liver ROS,   Endo/Other  Hypothyroidism   Renal/GU Renal diseaseKidney stone 03/2020, failed spinal- needed GA/ETT  negative genitourinary   Musculoskeletal negative musculoskeletal ROS (+)   Abdominal   Peds negative pediatric ROS (+)  Hematology  (+) Blood dyscrasia, anemia , hct 34.5, plt 192   Anesthesia Other Findings   Reproductive/Obstetrics (+) Pregnancy                             Anesthesia Physical Anesthesia Plan  ASA: II and emergent  Anesthesia Plan: Epidural   Post-op Pain Management:    Induction:   PONV Risk Score and Plan: 2  Airway Management Planned: Natural Airway  Additional Equipment: None  Intra-op Plan:   Post-operative Plan:   Informed Consent: I have reviewed the patients History and Physical, chart, labs and discussed the procedure including the risks, benefits and alternatives for the proposed anesthesia with the patient or authorized representative who has indicated his/her understanding and acceptance.       Plan Discussed with:   Anesthesia Plan Comments:         Anesthesia Quick Evaluation

## 2020-06-14 NOTE — Lactation Note (Addendum)
This note was copied from a baby's chart. Lactation Consultation Note  Patient Name: Jenny Barnes Today's Date: 06/14/2020 Reason for consult: Initial assessment;Mother's request;1st time breastfeeding;Term;Maternal endocrine disorder Type of Endocrine Disorder?: Thyroid (Hypothyroidism on 175 mcg po daily.)  Infant is 39 weeks 4 hours old. Mom has hypothyroidism. She is able to latch infant in cradle with 2 feedings of 30 minutes. LC assisted Mom to switch latch to football for better control and a deeper latch. Infant latched for 6 minutes and fell asleep. LC noted signs of milk transfer with the latch.   Mom having some abdominal pain at the time of the visit. RN applied heating packs to the lower abdomen. Mom has a Medela pump at home.  She denied any pain with the latch. LC set up manual pump and checked flange size of 24 which fit well and was comfortable.  Mom's nipples had some redness. LC talked with the RN and she will provided coconut oil for nipple care.   Plan 1. To feed infant based on cues 8-12 x in 24 hour period no more than 3 hours without an attempt.          2. To not use a pacifier for 4 weeks to establish a good latch.         3. To pump to increase stimulation q 3 hours for each breast using the manual for 10 minutes.          4. LC brochure of inpatient and outpatient services reviewed.          5. Parents are aware to track feedings, urine and fecal output on the I's and O's sheet.

## 2020-06-14 NOTE — MAU Note (Signed)
PT reports ctx about 2am.  Now about q4 min apart. Noticed some leaking around 5am happend 2x's. Good fetal movement. 1cm last week.

## 2020-06-15 ENCOUNTER — Inpatient Hospital Stay (HOSPITAL_COMMUNITY)
Admission: AD | Admit: 2020-06-15 | Payer: Medicaid Other | Source: Home / Self Care | Admitting: Obstetrics and Gynecology

## 2020-06-15 ENCOUNTER — Inpatient Hospital Stay (HOSPITAL_COMMUNITY): Payer: Medicaid Other

## 2020-06-15 LAB — CBC
HCT: 33.5 % — ABNORMAL LOW (ref 36.0–46.0)
Hemoglobin: 10.9 g/dL — ABNORMAL LOW (ref 12.0–15.0)
MCH: 30.2 pg (ref 26.0–34.0)
MCHC: 32.5 g/dL (ref 30.0–36.0)
MCV: 92.8 fL (ref 80.0–100.0)
Platelets: 190 10*3/uL (ref 150–400)
RBC: 3.61 MIL/uL — ABNORMAL LOW (ref 3.87–5.11)
RDW: 13.6 % (ref 11.5–15.5)
WBC: 11.1 10*3/uL — ABNORMAL HIGH (ref 4.0–10.5)
nRBC: 0 % (ref 0.0–0.2)

## 2020-06-15 LAB — TYPE AND SCREEN
ABO/RH(D): B POS
Antibody Screen: NEGATIVE

## 2020-06-15 LAB — RPR: RPR Ser Ql: NONREACTIVE

## 2020-06-15 NOTE — Anesthesia Postprocedure Evaluation (Signed)
Anesthesia Post Note  Patient: Jenny Barnes  Procedure(s) Performed: AN AD HOC LABOR EPIDURAL     Patient location during evaluation: Mother Baby Anesthesia Type: Epidural Level of consciousness: awake, awake and alert and oriented Pain management: pain level controlled Vital Signs Assessment: post-procedure vital signs reviewed and stable Respiratory status: spontaneous breathing and respiratory function stable Cardiovascular status: blood pressure returned to baseline Postop Assessment: no headache, epidural receding, patient able to bend at knees, adequate PO intake, no backache, no apparent nausea or vomiting and able to ambulate Anesthetic complications: no   No complications documented.  Last Vitals:  Vitals:   06/14/20 2023 06/15/20 0500  BP: 111/74 109/76  Pulse: 72 67  Resp: 16 16  Temp: 36.6 C 36.7 C  SpO2: 100% 99%    Last Pain:  Vitals:   06/15/20 0813  TempSrc:   PainSc: 3    Pain Goal:                   Cleda Clarks

## 2020-06-15 NOTE — Discharge Summary (Signed)
Postpartum Discharge Summary  Date of Service updated     Patient Name: Jenny Barnes DOB: 1996/08/10 MRN: [id number]  Date of admission: 06/14/2020 Delivery date:06/14/2020  Delivering provider: Lyda Kalata K  Date of discharge: 06/15/2020  Admitting diagnosis: Encounter for elective induction of labor [Z34.90] Intrauterine pregnancy: [redacted]w[redacted]d    Secondary diagnosis:  Active Problems:   Encounter for elective induction of labor  Additional problems: none    Discharge diagnosis: Term Pregnancy Delivered                                              Post partum procedures:none Augmentation: AROM and Pitocin Complications: None  Hospital course: Onset of Labor With Vaginal Delivery      24y.o. yo [id number] admitted in Latent Labor on 06/14/2020. Patient had an uncomplicated labor course as follows:  Membrane Rupture Time/Date: 1:49 PM ,06/14/2020   Delivery Method:Vaginal, Spontaneous  Episiotomy: None  Lacerations:  None  Patient had an uncomplicated postpartum course.  She is ambulating, tolerating a regular diet, passing flatus, and urinating well. Patient is discharged home in stable condition on 06/15/20.  Newborn Data: Birth date:06/14/2020  Birth time:2:04 PM  Gender:Female  Living status:Living  Apgars:9 ,9  Weight:2895 g   Magnesium Sulfate received: No BMZ received: No Rhophylac:No MMR:No T-DaP:Given prenatally Flu: Yes Transfusion:No  Physical exam  Vitals:   06/14/20 1616 06/14/20 1714 06/14/20 2023 06/15/20 0500  BP: 112/69 107/71 111/74 109/76  Pulse: 62 73 72 67  Resp: '16 16 16 16  ' Temp:   97.9 F (36.6 C) 98 F (36.7 C)  TempSrc:   Axillary Oral  SpO2:  100% 100% 99%  Weight:      Height:       Labs: Lab Results  Component Value Date   WBC 11.1 (H) 06/15/2020   HGB 10.9 (L) 06/15/2020   HCT 33.5 (L) 06/15/2020   MCV 92.8 06/15/2020   PLT 190 06/15/2020   CMP Latest Ref Rng & Units 06/07/2020   Glucose 70 - 99 mg/dL 76  BUN 6 - 20 mg/dL 14  Creatinine 0.44 - 1.00 mg/dL 0.68  Sodium 135 - 145 mmol/L 135  Potassium 3.5 - 5.1 mmol/L 3.8  Chloride 98 - 111 mmol/L 102  CO2 22 - 32 mmol/L 23  Calcium 8.9 - 10.3 mg/dL 9.5  Total Protein 6.5 - 8.1 g/dL 6.5  Total Bilirubin 0.3 - 1.2 mg/dL 0.7  Alkaline Phos 38 - 126 U/L 65  AST 15 - 41 U/L 13(L)  ALT 0 - 44 U/L 11   Edinburgh Score: Edinburgh Postnatal Depression Scale Screening Tool 06/14/2020  I have been able to laugh and see the funny side of things. (No Data)  I have looked forward with enjoyment to things. -  I have blamed myself unnecessarily when things went wrong. -  I have been anxious or worried for no good reason. -  I have felt scared or panicky for no good reason. -  Things have been getting on top of me. -  I have been so unhappy that I have had difficulty sleeping. -  I have felt sad or miserable. -  I have been so unhappy that I have been crying. -  The thought of harming myself has occurred to me. - Flavia Shipperostnatal Depression Scale Total -  After visit meds:  Allergies as of 06/15/2020   No Known Allergies     Medication List    TAKE these medications   acetaminophen 500 MG tablet Commonly known as: TYLENOL Take 2 tablets (1,000 mg total) by mouth every 8 (eight) hours.   cyclobenzaprine 10 MG tablet Commonly known as: FLEXERIL Take 1 tablet (10 mg total) by mouth 2 (two) times daily as needed for muscle spasms.   levothyroxine 200 MCG tablet Commonly known as: SYNTHROID Take 225 mcg by mouth daily before breakfast.   meclizine 12.5 MG tablet Commonly known as: ANTIVERT Take 1 tablet (12.5 mg total) by mouth 3 (three) times daily as needed for dizziness.   ondansetron 4 MG tablet Commonly known as: ZOFRAN Take 4 mg by mouth every 8 (eight) hours as needed for nausea or vomiting.   prenatal multivitamin Tabs tablet Take 1 tablet by mouth daily at 12 noon.   promethazine 25 MG  tablet Commonly known as: PHENERGAN Take 1 tablet (25 mg total) by mouth every 6 (six) hours as needed for nausea or vomiting.            Discharge Care Instructions  (From admission, onward)         Start     Ordered   06/15/20 0000  Discharge wound care:       Comments: Sitz baths and icepacks to perineum.  If stitches, they will dissolve.   06/15/20 0747           Discharge home in stable condition Infant Feeding: Breast Infant Disposition:home with mother Discharge instruction: per After Visit Summary and Postpartum booklet. Activity: Advance as tolerated. Pelvic rest for 6 weeks.  Diet: routine diet Anticipated Birth Control: Unsure Postpartum Appointment:4 weeks Additional Postpartum F/U: none Future Appointments:No future appointments. Follow up Visit:  Follow-up Information    Taam-Akelman, Lawrence Santiago, MD Follow up in 4 week(s).   Specialty: Obstetrics and Gynecology Contact information: [place] [address] [phone]                   06/15/2020 Daria Pastures, MD

## 2020-06-15 NOTE — Progress Notes (Signed)
Patient is eating, ambulating, voiding.  Pain control is good.  Vitals:   06/14/20 1616 06/14/20 1714 06/14/20 2023 06/15/20 0500  BP: 112/69 107/71 111/74 109/76  Pulse: 62 73 72 67  Resp: 16 16 16 16   Temp:   97.9 F (36.6 C) 98 F (36.7 C)  TempSrc:   Axillary Oral  SpO2:  100% 100% 99%  Weight:      Height:        Fundus firm Perineum without swelling.  Lab Results  Component Value Date   WBC 11.1 (H) 06/15/2020   HGB 10.9 (L) 06/15/2020   HCT 33.5 (L) 06/15/2020   MCV 92.8 06/15/2020   PLT 190 06/15/2020    --/--/B POS (10/18 1219)/RI  A/P Post partum day 1.  Routine care.  Expect d/c today.  Flu and covid vaccines d/w pt.   05-12-1992

## 2020-06-15 NOTE — Lactation Note (Signed)
This note was copied from a baby's chart. Lactation Consultation Note  Patient Name: Jenny Barnes Today's Date: 06/15/2020 Reason for consult: Follow-up assessment;Nipple pain/trauma   Mom has sore nipples.  Compression stripe noted on right side with pain of 7 during latching.  Right nipple is red and has a couple of small blisters with a pain of 4 with latching.  Mom states infant has not stayed on the breast with latching today.  She comes on and off constantly. Mom is able to hand express.    She has a 24 month old she was not able to breastfeed due to latching and her milk volume not increasing until wk. 3 pp.   She only used a manual with her first child.  LC observed infant attempting to latch STS.  Infant has strong suck with tighting in the posterior area of the tongue.  Infant will latch to nipple then lose latch.  Infant is "gassy" per mom and mom states she has to take her off the breast to burp her because she screams.  She also said her nipples appear pinched badly after the BF.  LC observed latch/ infant was unable to sustain latch but attempted several times. NS 20# applied, infant latched with narrow gape but easily sustained latch and rhythmic, continuous jaw movement noted.  Mom felt pain was decreased.  LC tugged at chin to widen gape but infant had tight jaws.   Infant fed 10 minutes on one side then LC assisted wtih latch on other side in laid back position hoping to relax jaw/body.  Infant fed 5 minutes before falling asleep.  Pain decreased from 7 without shield to 4 with shield.   LC explained DEBP, parts, usage and frequency all discussed.  LC set up pump and mom plans to pump after eating.   Pacifier use discussed.  Parents are using a pacifier.    LC recommended getting an OP LC appt. Due to NS use.  Comfort gels provided.  Patient has brochure with resources which were discussed in depth.      Maternal Data Has patient been taught Hand  Expression?: Yes  Feeding Feeding Type: Breast Fed  LATCH Score Latch: Too sleepy or reluctant, no latch achieved, no sucking elicited.  Audible Swallowing: None  Type of Nipple: Everted at rest and after stimulation  Comfort (Breast/Nipple): Filling, red/small blisters or bruises, mild/mod discomfort  Hold (Positioning): Assistance needed to correctly position infant at breast and maintain latch.  LATCH Score: 4  Interventions Interventions: Breast feeding basics reviewed;Assisted with latch;Skin to skin;Hand express;Breast massage;Position options;Support pillows;Adjust position;Breast compression;DEBP;Comfort gels  Lactation Tools Discussed/Used WIC Program: Yes Pump Review: Setup, frequency, and cleaning;Milk Storage Initiated by:: Threasa Alpha Date initiated:: 06/15/20   Consult Status Consult Status: Follow-up Date: 06/16/20 Follow-up type: In-patient    Jenny Barnes [facility] 06/15/2020, 1:57 PM

## 2020-07-13 DIAGNOSIS — R42 Dizziness and giddiness: Secondary | ICD-10-CM | POA: Diagnosis not present

## 2020-07-13 DIAGNOSIS — E039 Hypothyroidism, unspecified: Secondary | ICD-10-CM | POA: Diagnosis not present

## 2020-07-29 DIAGNOSIS — F419 Anxiety disorder, unspecified: Secondary | ICD-10-CM | POA: Diagnosis not present

## 2020-07-29 DIAGNOSIS — M545 Low back pain, unspecified: Secondary | ICD-10-CM | POA: Diagnosis not present

## 2020-07-29 DIAGNOSIS — F53 Postpartum depression: Secondary | ICD-10-CM | POA: Diagnosis not present

## 2020-08-09 DIAGNOSIS — Z20822 Contact with and (suspected) exposure to covid-19: Secondary | ICD-10-CM | POA: Diagnosis not present

## 2020-08-09 DIAGNOSIS — Z03818 Encounter for observation for suspected exposure to other biological agents ruled out: Secondary | ICD-10-CM | POA: Diagnosis not present

## 2020-08-09 DIAGNOSIS — U071 COVID-19: Secondary | ICD-10-CM | POA: Diagnosis not present

## 2020-12-04 IMAGING — CT CT ABD-PELV W/ CM
2 of 4 series · 16 of 46 positions shown, 18 images · IV contrast (Omni 300)
Comparison: 05/04/2016

CLINICAL DATA: Right lower quadrant pain

EXAM:
CT ABDOMEN AND PELVIS WITH CONTRAST
TECHNIQUE: Multidetector CT imaging of the abdomen and pelvis was performed
using the standard protocol following bolus administration of
intravenous contrast.
CONTRAST:  100mL OMNIPAQUE IOHEXOL 300 MG/ML  SOLN

[Series 3: a/p w/ 5mm · axial · 0.72mm/px · z∈[+785,+1190]mm · 13 of 92 slices shown, 15 images]
[im 7/92  soft-tissue]
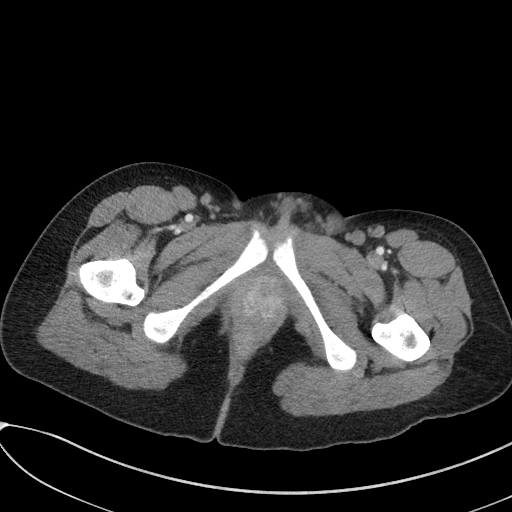
[im 7/92  bone]
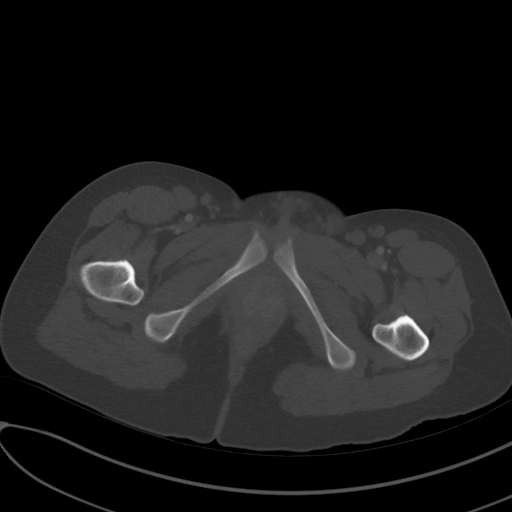
[im 14/92  soft-tissue]
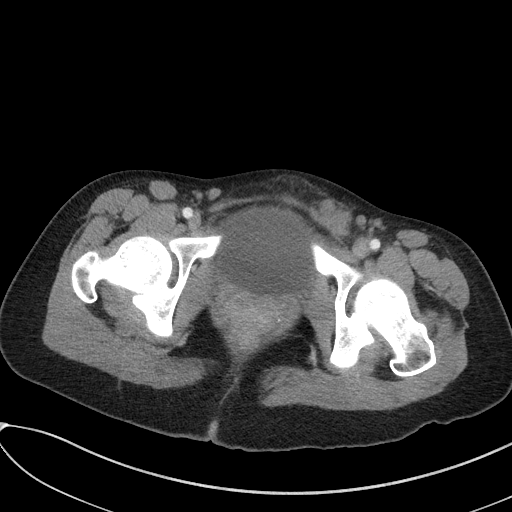
[im 21/92  soft-tissue]
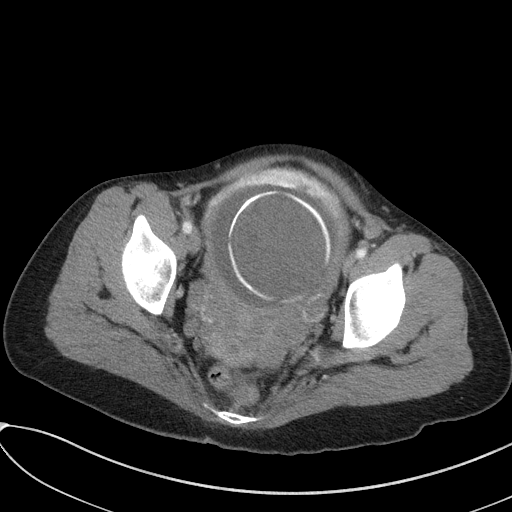
[im 27/92  soft-tissue]
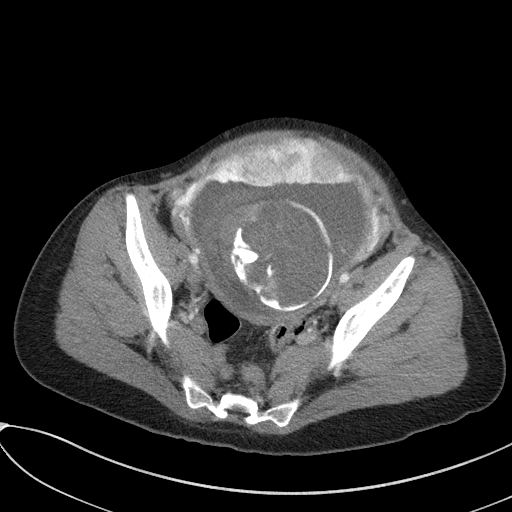
[im 34/92  soft-tissue]
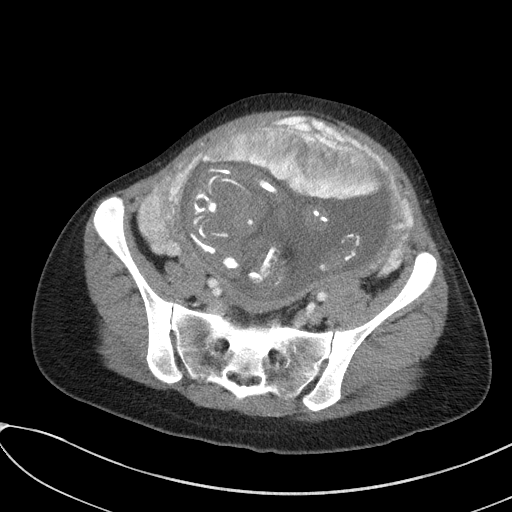
[im 41/92  soft-tissue]
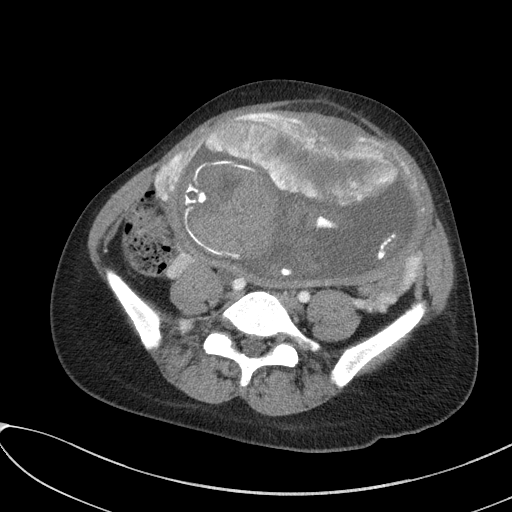
[im 48/92  soft-tissue]
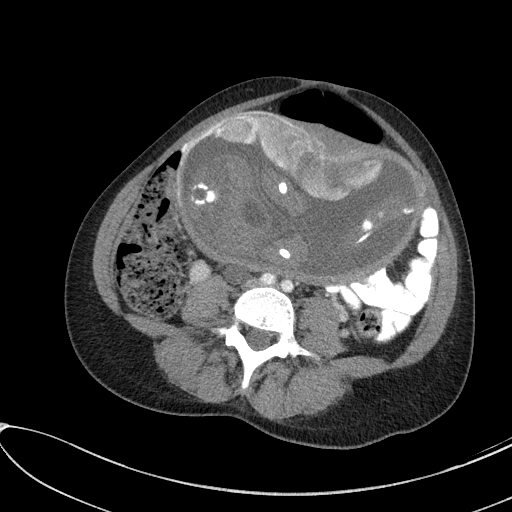
[im 54/92  soft-tissue]
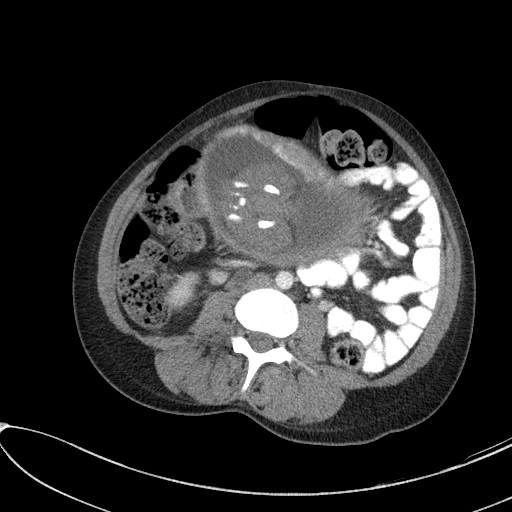
[im 61/92  soft-tissue]
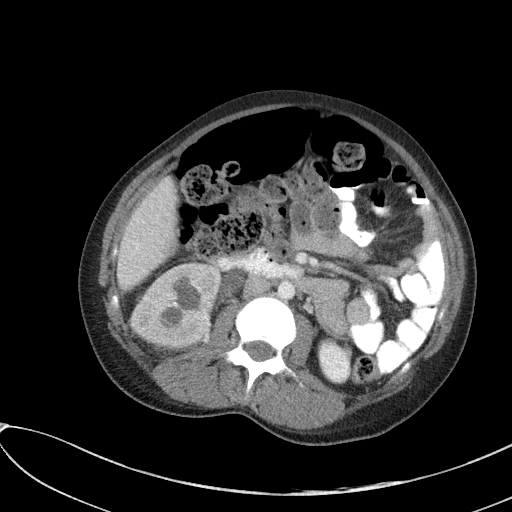
[im 61/92  bone]
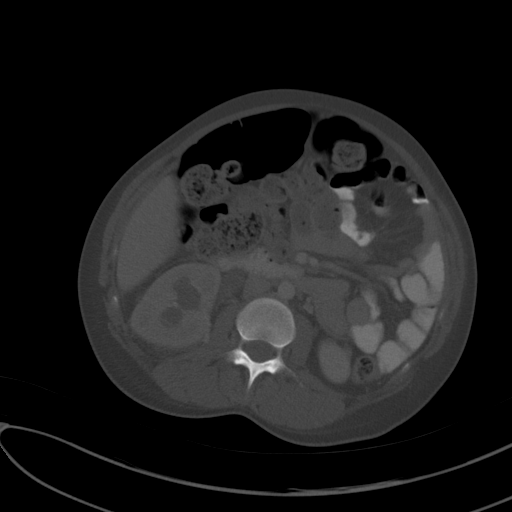
[im 68/92  soft-tissue]
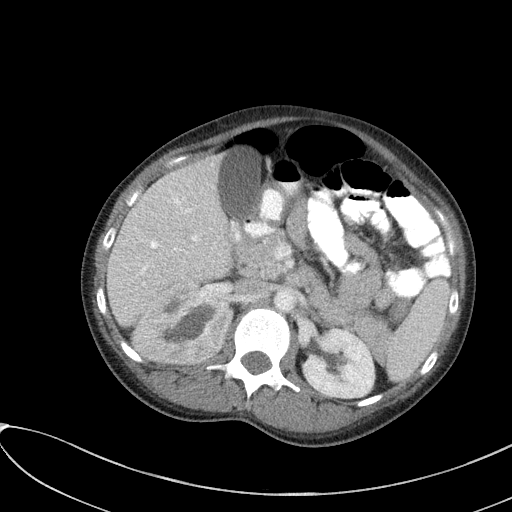
[im 75/92  soft-tissue]
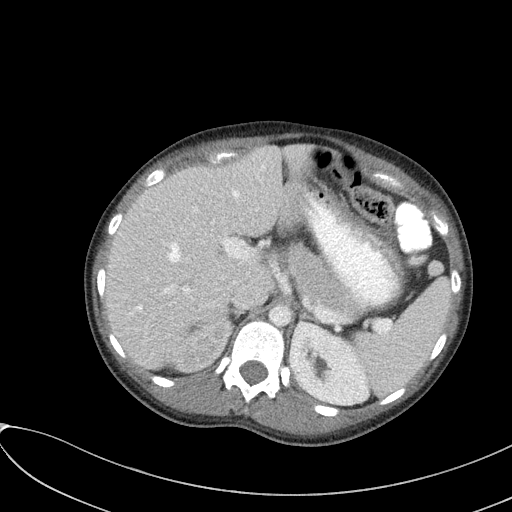
[im 81/92  soft-tissue]
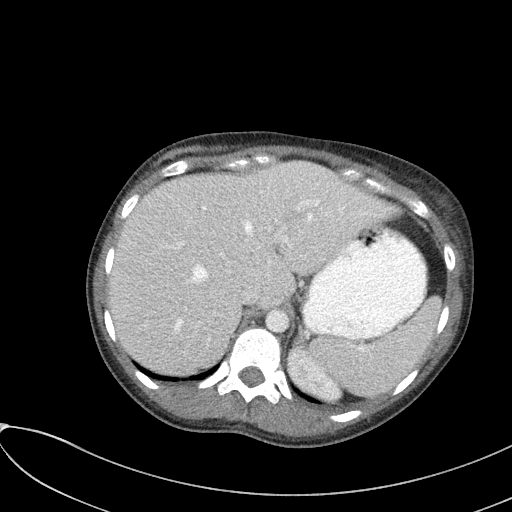
[im 88/92  soft-tissue]
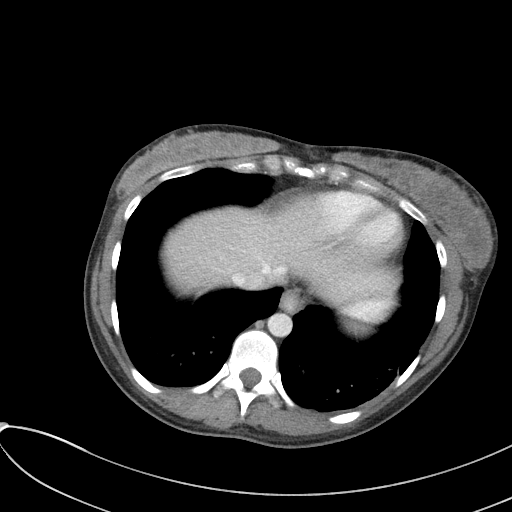

[Series 6: a/p w/ cor · coronal · 0.69mm/px · 3 of 139 slices shown]
[im 47/139  soft-tissue]
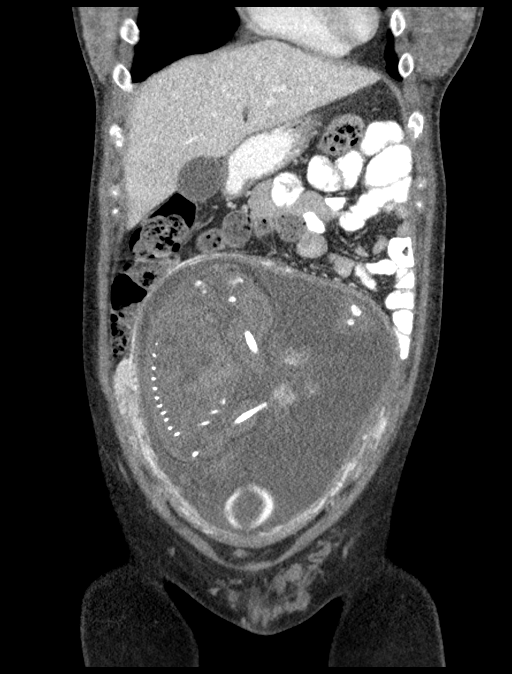
[im 62/139  soft-tissue]
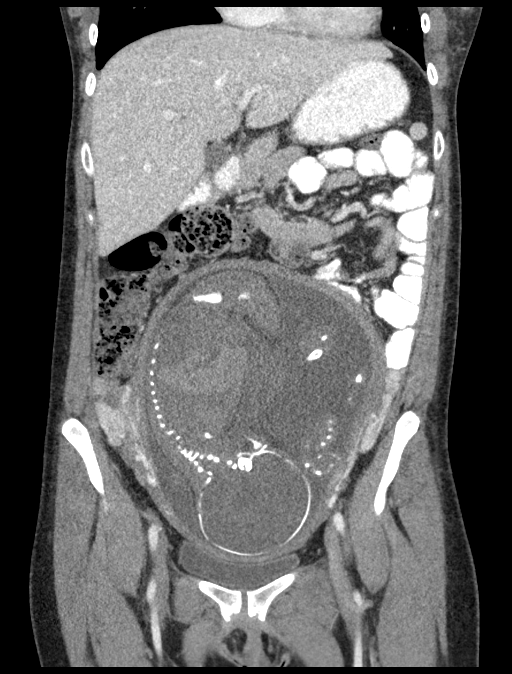
[im 77/139  soft-tissue]
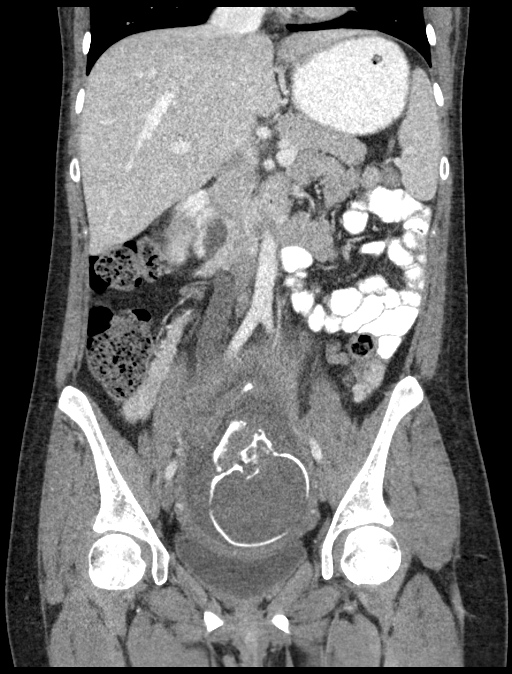

[16 of 46 positions shown; findings below may reference images not displayed]

FINDINGS: Lower chest: Minimal left base atelectasis.  No effusions.

Hepatobiliary: No focal hepatic abnormality. Gallbladder
unremarkable.

Pancreas: No focal abnormality or ductal dilatation.

Spleen: No focal abnormality.  Normal size.

Adrenals/Urinary Tract: Punctate 2 mm right UVJ stone with severe
right hydronephrosis. 2-3 mm nonobstructing stone in the midpole of
the right kidney. No stones or hydronephrosis on the left. Delayed
excretion of contrast from the right kidney due to obstruction.
Urinary bladder and adrenal glands unremarkable.

Stomach/Bowel: Stomach, large and small bowel grossly unremarkable.
Moderate stool in the colon.

Vascular/Lymphatic: No evidence of aneurysm or adenopathy.

Reproductive: Intrauterine gestation noted which appears of advanced
gestational age. Anterior placenta. Cephalic presentation.

Other: No free fluid or free air.

Musculoskeletal: No acute bony abnormality.
IMPRESSION: 2 mm right UVJ stone with moderate to severe right hydronephrosis.
Right nephrolithiasis.

Intrauterine gestation which appears to be of advanced gestational
age.

Moderate stool throughout the colon.

## 2021-05-26 DIAGNOSIS — E890 Postprocedural hypothyroidism: Secondary | ICD-10-CM | POA: Diagnosis not present

## 2021-05-26 DIAGNOSIS — Z9114 Patient's other noncompliance with medication regimen: Secondary | ICD-10-CM | POA: Diagnosis not present

## 2021-07-05 DIAGNOSIS — E890 Postprocedural hypothyroidism: Secondary | ICD-10-CM | POA: Diagnosis not present

## 2021-12-28 DIAGNOSIS — J029 Acute pharyngitis, unspecified: Secondary | ICD-10-CM | POA: Diagnosis not present

## 2022-01-30 DIAGNOSIS — Z3201 Encounter for pregnancy test, result positive: Secondary | ICD-10-CM | POA: Diagnosis not present

## 2022-02-01 DIAGNOSIS — Z3480 Encounter for supervision of other normal pregnancy, unspecified trimester: Secondary | ICD-10-CM | POA: Diagnosis not present

## 2022-02-01 DIAGNOSIS — O26841 Uterine size-date discrepancy, first trimester: Secondary | ICD-10-CM | POA: Diagnosis not present

## 2022-02-14 DIAGNOSIS — Z124 Encounter for screening for malignant neoplasm of cervix: Secondary | ICD-10-CM | POA: Diagnosis not present

## 2022-02-14 DIAGNOSIS — Z113 Encounter for screening for infections with a predominantly sexual mode of transmission: Secondary | ICD-10-CM | POA: Diagnosis not present

## 2022-02-14 DIAGNOSIS — O3680X9 Pregnancy with inconclusive fetal viability, other fetus: Secondary | ICD-10-CM | POA: Diagnosis not present

## 2022-02-14 DIAGNOSIS — Z3491 Encounter for supervision of normal pregnancy, unspecified, first trimester: Secondary | ICD-10-CM | POA: Diagnosis not present

## 2022-02-14 LAB — OB RESULTS CONSOLE GC/CHLAMYDIA
Chlamydia: NEGATIVE
Neisseria Gonorrhea: NEGATIVE

## 2022-02-16 DIAGNOSIS — E039 Hypothyroidism, unspecified: Secondary | ICD-10-CM | POA: Diagnosis not present

## 2022-02-21 DIAGNOSIS — E039 Hypothyroidism, unspecified: Secondary | ICD-10-CM | POA: Diagnosis not present

## 2022-02-21 DIAGNOSIS — Z3480 Encounter for supervision of other normal pregnancy, unspecified trimester: Secondary | ICD-10-CM | POA: Diagnosis not present

## 2022-02-21 LAB — OB RESULTS CONSOLE RUBELLA ANTIBODY, IGM: Rubella: IMMUNE

## 2022-02-21 LAB — OB RESULTS CONSOLE HEPATITIS B SURFACE ANTIGEN: Hepatitis B Surface Ag: NEGATIVE

## 2022-02-21 LAB — OB RESULTS CONSOLE HIV ANTIBODY (ROUTINE TESTING): HIV: NONREACTIVE

## 2022-02-21 LAB — OB RESULTS CONSOLE RPR: RPR: NONREACTIVE

## 2022-02-21 LAB — OB RESULTS CONSOLE ABO/RH: RH Type: POSITIVE

## 2022-02-21 LAB — OB RESULTS CONSOLE ANTIBODY SCREEN: Antibody Screen: NEGATIVE

## 2022-02-21 LAB — HEPATITIS C ANTIBODY: HCV Ab: NEGATIVE

## 2022-03-09 DIAGNOSIS — Z3A12 12 weeks gestation of pregnancy: Secondary | ICD-10-CM | POA: Diagnosis not present

## 2022-03-09 DIAGNOSIS — K5909 Other constipation: Secondary | ICD-10-CM | POA: Diagnosis not present

## 2022-03-09 DIAGNOSIS — E890 Postprocedural hypothyroidism: Secondary | ICD-10-CM | POA: Diagnosis not present

## 2022-03-09 DIAGNOSIS — R031 Nonspecific low blood-pressure reading: Secondary | ICD-10-CM | POA: Diagnosis not present

## 2022-03-14 DIAGNOSIS — Z3481 Encounter for supervision of other normal pregnancy, first trimester: Secondary | ICD-10-CM | POA: Diagnosis not present

## 2022-04-11 DIAGNOSIS — Z3482 Encounter for supervision of other normal pregnancy, second trimester: Secondary | ICD-10-CM | POA: Diagnosis not present

## 2022-04-11 DIAGNOSIS — E039 Hypothyroidism, unspecified: Secondary | ICD-10-CM | POA: Diagnosis not present

## 2022-04-18 DIAGNOSIS — N760 Acute vaginitis: Secondary | ICD-10-CM | POA: Diagnosis not present

## 2022-05-11 DIAGNOSIS — Z3A19 19 weeks gestation of pregnancy: Secondary | ICD-10-CM | POA: Diagnosis not present

## 2022-05-11 DIAGNOSIS — Z363 Encounter for antenatal screening for malformations: Secondary | ICD-10-CM | POA: Diagnosis not present

## 2022-06-28 DIAGNOSIS — Z23 Encounter for immunization: Secondary | ICD-10-CM | POA: Diagnosis not present

## 2022-07-05 DIAGNOSIS — R309 Painful micturition, unspecified: Secondary | ICD-10-CM | POA: Diagnosis not present

## 2022-07-26 DIAGNOSIS — E039 Hypothyroidism, unspecified: Secondary | ICD-10-CM | POA: Diagnosis not present

## 2022-08-07 DIAGNOSIS — R509 Fever, unspecified: Secondary | ICD-10-CM | POA: Diagnosis not present

## 2022-08-07 DIAGNOSIS — H6993 Unspecified Eustachian tube disorder, bilateral: Secondary | ICD-10-CM | POA: Diagnosis not present

## 2022-08-07 DIAGNOSIS — Z3A33 33 weeks gestation of pregnancy: Secondary | ICD-10-CM | POA: Diagnosis not present

## 2022-08-08 DIAGNOSIS — R509 Fever, unspecified: Secondary | ICD-10-CM | POA: Diagnosis not present

## 2022-08-24 DIAGNOSIS — E039 Hypothyroidism, unspecified: Secondary | ICD-10-CM | POA: Diagnosis not present

## 2022-08-28 NOTE — L&D Delivery Note (Signed)
Delivery Note Patient presented for IOL for post term pregnancy.  She had foley balloon and pitocin started at 0750.  SROM with foley balloon placement.  She then progressed rapidly to 10 cm.  She pushed well with two contractions.  At 1:27 PM a viable female was delivered via Vaginal, Spontaneous (Presentation: Right Occiput Anterior).  APGAR: 9, 9; weight 3370 gm (7lb 6.9 oz) Placenta status: Spontaneous, Intact.  Cord: 3 vessels with the following complications: None.  Cord pH: n/a  Anesthesia: None Episiotomy: None Lacerations: None Suture Repair:  n/a Est. Blood Loss (mL): 75  Mom to postpartum.  Baby to Couplet care / Skin to Skin.  [place] 10/02/2022, 1:46 PM

## 2022-09-04 ENCOUNTER — Inpatient Hospital Stay (HOSPITAL_COMMUNITY)
Admission: AD | Admit: 2022-09-04 | Discharge: 2022-09-04 | Disposition: A | Discharge status: Home / Self Care | Payer: Medicaid Other | Attending: Obstetrics and Gynecology | Admitting: Obstetrics and Gynecology

## 2022-09-04 ENCOUNTER — Encounter (HOSPITAL_COMMUNITY): Payer: Self-pay | Admitting: Obstetrics and Gynecology

## 2022-09-04 ENCOUNTER — Other Ambulatory Visit: Payer: Self-pay

## 2022-09-04 DIAGNOSIS — O26893 Other specified pregnancy related conditions, third trimester: Secondary | ICD-10-CM | POA: Diagnosis not present

## 2022-09-04 DIAGNOSIS — Z3A36 36 weeks gestation of pregnancy: Secondary | ICD-10-CM | POA: Diagnosis not present

## 2022-09-04 DIAGNOSIS — O4703 False labor before 37 completed weeks of gestation, third trimester: Secondary | ICD-10-CM | POA: Diagnosis not present

## 2022-09-04 LAB — POCT FERN TEST: POCT Fern Test: NEGATIVE

## 2022-09-04 NOTE — MAU Note (Signed)
Jenny Barnes is a 27 y.o. at [redacted]w[redacted]d here in MAU reporting: LOF since 1230 today. Pt states experiencing a constant dampness, denies large gush of fluid or bleeding. Reports positive FM and mild irregular CTX  Pain score: 4/10 There were no vitals filed for this visit.   FHT: 124 bpm Lab orders placed from triage: Maryann Alar

## 2022-09-04 NOTE — MAU Provider Note (Signed)
Labor Check      S: [name] is a 27 y.o. [id number] at [redacted]w[redacted]d  who presents to MAU today complaining constant damness. She was evaluated by nursing - neg Maryann Alar  O: BP 112/65   Pulse (!) 101   Temp 98.2 F (36.8 C) (Oral)   Resp 17   SpO2 96%  Evaluated by nurse.    Fetal Monitoring: Baseline: 130 Variability: moderate Accelerations: present Decelerations: none Contractions: none   A: SIUP at [redacted]w[redacted]d  False labor  P: Discharge to home.  Truett Mainland, [place] 1/8/20246:13 PM

## 2022-09-07 DIAGNOSIS — Z3480 Encounter for supervision of other normal pregnancy, unspecified trimester: Secondary | ICD-10-CM | POA: Diagnosis not present

## 2022-09-07 DIAGNOSIS — Z113 Encounter for screening for infections with a predominantly sexual mode of transmission: Secondary | ICD-10-CM | POA: Diagnosis not present

## 2022-09-11 ENCOUNTER — Inpatient Hospital Stay (HOSPITAL_COMMUNITY)
Admission: AD | Admit: 2022-09-11 | Discharge: 2022-09-11 | Disposition: A | Discharge status: Home / Self Care | Payer: Medicaid Other | Attending: Obstetrics and Gynecology | Admitting: Obstetrics and Gynecology

## 2022-09-11 ENCOUNTER — Encounter (HOSPITAL_COMMUNITY): Payer: Self-pay | Admitting: Obstetrics and Gynecology

## 2022-09-11 DIAGNOSIS — O9A213 Injury, poisoning and certain other consequences of external causes complicating pregnancy, third trimester: Secondary | ICD-10-CM | POA: Insufficient documentation

## 2022-09-11 DIAGNOSIS — S3991XA Unspecified injury of abdomen, initial encounter: Secondary | ICD-10-CM | POA: Diagnosis not present

## 2022-09-11 DIAGNOSIS — W2209XA Striking against other stationary object, initial encounter: Secondary | ICD-10-CM | POA: Diagnosis not present

## 2022-09-11 DIAGNOSIS — Z3A37 37 weeks gestation of pregnancy: Secondary | ICD-10-CM

## 2022-09-11 LAB — OB RESULTS CONSOLE GBS: GBS: POSITIVE

## 2022-09-11 NOTE — MAU Note (Signed)
.  Jenny Barnes is a 27 y.o. at [redacted]w[redacted]d here in MAU reporting: her abd was hit by a heavy metal door at 1030 today. Reports she did not feel the baby move for like 2 hours afterward and the doctor recommended she come to the hospital. Pt reports positive fetal movement in route to hospital. Denies vaginal bleeding. Reports pain at the actual site the door hit (left of umbilicus) at the time and for about an hour. Now pain is just with bending.   Onset of complaint: 1030 Pain score: 2/10 Vitals:   09/11/22 1332 09/11/22 1335  BP: 106/63   Pulse: 97   Resp: 16   Temp: 97.7 F (36.5 C)   SpO2:  98%     FHT:129 Lab orders placed from triage:

## 2022-09-11 NOTE — MAU Provider Note (Signed)
History     CSN: [id number]  Arrival date and time: 09/11/22 1315   Event Date/Time   First Provider Initiated Contact with Patient 09/11/22 1407      Chief Complaint  Patient presents with   Abdominal Pain   HPI  [name] is a 27 y.o. female G3P2002 @ [redacted]w[redacted]d here in MAU with complaints of an accident that happened today. She was opening a metal door on her camper and the door hit her in the abdomen. This occurred around 1030. She reports feeling less movement prior to coming to MAU. She reports more movement now that she is in MAU and on the monitor. She has no pain or vaginal bleeding.   OB History     Gravida  3   Para  2   Term  2   Preterm      AB      Living  2      SAB      IAB      Ectopic      Multiple  0   Live Births  2           Past Medical History:  Diagnosis Date   Hypothyroidism    Kidney stone    August 2021   Thyroid disease     Past Surgical History:  Procedure Laterality Date   CYSTOSCOPY W/ URETERAL STENT REMOVAL     CYSTOSCOPY WITH RETROGRADE PYELOGRAM, URETEROSCOPY AND STENT PLACEMENT Right 04/09/2020   Procedure: URETEROSCOPY, BASKETING OF STONE & DOUBLE J. STENT PLACEMENT;  Surgeon: Alexis Frock, MD;  Location: [place];  Service: Urology;  Laterality: Right;   WISDOM TOOTH EXTRACTION      Family History  Problem Relation Age of Onset   Kidney Stones Mother    Kidney Stones Sister     Social History   Tobacco Use   Smoking status: Never   Smokeless tobacco: Never  Vaping Use   Vaping Use: Never used  Substance Use Topics   Alcohol use: No   Drug use: Never    Allergies: No Known Allergies  Medications Prior to Admission  Medication Sig Dispense Refill Last Dose   levothyroxine (SYNTHROID) 200 MCG tablet Take 225 mcg by mouth daily before breakfast.    09/11/2022   ondansetron (ZOFRAN) 4 MG tablet Take 4 mg by mouth every 8 (eight) hours as needed for nausea or vomiting.   09/10/2022    Prenatal Vit-Fe Fumarate-FA (PRENATAL MULTIVITAMIN) TABS tablet Take 1 tablet by mouth daily at 12 noon.   09/10/2022   acetaminophen (TYLENOL) 500 MG tablet Take 2 tablets (1,000 mg total) by mouth every 8 (eight) hours. 30 tablet 0    cyclobenzaprine (FLEXERIL) 10 MG tablet Take 1 tablet (10 mg total) by mouth 2 (two) times daily as needed for muscle spasms. 20 tablet 0    meclizine (ANTIVERT) 12.5 MG tablet Take 1 tablet (12.5 mg total) by mouth 3 (three) times daily as needed for dizziness. 30 tablet 0    promethazine (PHENERGAN) 25 MG tablet Take 1 tablet (25 mg total) by mouth every 6 (six) hours as needed for nausea or vomiting. 30 tablet 0    No results found for this or any previous visit (from the past 48 hour(s)).   Review of Systems  Constitutional:  Negative for fever.  Gastrointestinal:  Positive for abdominal pain.  Genitourinary:  Negative for vaginal bleeding and vaginal discharge.   Physical Exam   Blood pressure 106/63,  pulse 97, temperature 97.7 F (36.5 C), temperature source Oral, resp. rate 16, height 5\' 4"  (1.626 m), weight 70.3 kg, SpO2 98 %, unknown if currently breastfeeding.  Physical Exam Constitutional:      General: She is not in acute distress.    Appearance: She is well-developed. She is not ill-appearing, toxic-appearing or diaphoretic.  HENT:     Head: Normocephalic.  Abdominal:     Palpations: Abdomen is soft.     Tenderness: There is no abdominal tenderness.  Skin:    General: Skin is warm.  Neurological:     Mental Status: She is alert and oriented to person, place, and time.   Fetal Tracing: Baseline: 130 bpm Variability: Moderate  Accelerations: 15x15 Decelerations: none Toco: Quiet   MAU Course  Procedures None  MDM  Reactive NST, feeling good movements now.   Assessment and Plan   A:  1. Abdominal trauma, initial encounter   2. [redacted] weeks gestation of pregnancy      P:  Dc home Return to MAU ifs symptoms worsen Fetal  kick counts reviewed  Lezlie Lye, NP 09/11/2022 5:22 PM

## 2022-09-20 ENCOUNTER — Inpatient Hospital Stay (HOSPITAL_COMMUNITY)
Admission: AD | Admit: 2022-09-20 | Discharge: 2022-09-20 | Disposition: A | Discharge status: Home / Self Care | Payer: Medicaid Other | Attending: Obstetrics and Gynecology | Admitting: Obstetrics and Gynecology

## 2022-09-20 ENCOUNTER — Encounter (HOSPITAL_COMMUNITY): Payer: Self-pay | Admitting: Obstetrics and Gynecology

## 2022-09-20 DIAGNOSIS — O212 Late vomiting of pregnancy: Secondary | ICD-10-CM | POA: Diagnosis not present

## 2022-09-20 DIAGNOSIS — O99283 Endocrine, nutritional and metabolic diseases complicating pregnancy, third trimester: Secondary | ICD-10-CM | POA: Insufficient documentation

## 2022-09-20 DIAGNOSIS — O26893 Other specified pregnancy related conditions, third trimester: Secondary | ICD-10-CM | POA: Insufficient documentation

## 2022-09-20 DIAGNOSIS — R112 Nausea with vomiting, unspecified: Secondary | ICD-10-CM | POA: Diagnosis not present

## 2022-09-20 DIAGNOSIS — Z3A38 38 weeks gestation of pregnancy: Secondary | ICD-10-CM | POA: Diagnosis not present

## 2022-09-20 DIAGNOSIS — Z1152 Encounter for screening for COVID-19: Secondary | ICD-10-CM | POA: Diagnosis not present

## 2022-09-20 LAB — URINALYSIS, ROUTINE W REFLEX MICROSCOPIC
Bilirubin Urine: NEGATIVE
Glucose, UA: NEGATIVE mg/dL
Hgb urine dipstick: NEGATIVE
Ketones, ur: NEGATIVE mg/dL
Nitrite: NEGATIVE
Protein, ur: NEGATIVE mg/dL
Specific Gravity, Urine: 1.009 (ref 1.005–1.030)
pH: 7 (ref 5.0–8.0)

## 2022-09-20 LAB — COMPREHENSIVE METABOLIC PANEL
ALT: 7 U/L (ref 0–44)
AST: 14 U/L — ABNORMAL LOW (ref 15–41)
Albumin: 3 g/dL — ABNORMAL LOW (ref 3.5–5.0)
Alkaline Phosphatase: 69 U/L (ref 38–126)
Anion gap: 8 (ref 5–15)
BUN: 12 mg/dL (ref 6–20)
CO2: 24 mmol/L (ref 22–32)
Calcium: 9.1 mg/dL (ref 8.9–10.3)
Chloride: 103 mmol/L (ref 98–111)
Creatinine, Ser: 0.69 mg/dL (ref 0.44–1.00)
GFR, Estimated: >60 mL/min (ref >60)
Glucose, Bld: 77 mg/dL (ref 70–99)
Potassium: 3.9 mmol/L (ref 3.5–5.1)
Sodium: 135 mmol/L (ref 135–145)
Total Bilirubin: 0.5 mg/dL (ref 0.3–1.2)
Total Protein: 6.3 g/dL — ABNORMAL LOW (ref 6.5–8.1)

## 2022-09-20 LAB — CBC
HCT: 29.8 % — ABNORMAL LOW (ref 36.0–46.0)
Hemoglobin: 9.8 g/dL — ABNORMAL LOW (ref 12.0–15.0)
MCH: 29.7 pg (ref 26.0–34.0)
MCHC: 32.9 g/dL (ref 30.0–36.0)
MCV: 90.3 fL (ref 80.0–100.0)
Platelets: 221 10*3/uL (ref 150–400)
RBC: 3.3 MIL/uL — ABNORMAL LOW (ref 3.87–5.11)
RDW: 13.3 % (ref 11.5–15.5)
WBC: 7.3 10*3/uL (ref 4.0–10.5)
nRBC: 0 % (ref 0.0–0.2)

## 2022-09-20 LAB — RESP PANEL BY RT-PCR (RSV, FLU A&B, COVID)  RVPGX2
Influenza A by PCR: NEGATIVE
Influenza B by PCR: NEGATIVE
Resp Syncytial Virus by PCR: NEGATIVE
SARS Coronavirus 2 by RT PCR: NEGATIVE

## 2022-09-20 MED ORDER — LACTATED RINGERS IV SOLN
Freq: Once | INTRAVENOUS | Status: DC
  Filled 2022-09-20: qty 1000

## 2022-09-20 MED ORDER — PROCHLORPERAZINE EDISYLATE 10 MG/2ML IJ SOLN
10.0000 mg | Freq: Once | INTRAMUSCULAR | Status: AC
  Administered 2022-09-20: 10 mg via INTRAVENOUS
  Filled 2022-09-20: qty 2

## 2022-09-20 MED ORDER — LACTATED RINGERS IV BOLUS
1000.0000 mL | Freq: Once | INTRAVENOUS | Status: AC
  Administered 2022-09-20: 1000 mL via INTRAVENOUS

## 2022-09-20 MED ORDER — FAMOTIDINE IN NACL 20-0.9 MG/50ML-% IV SOLN
20.0000 mg | Freq: Once | INTRAVENOUS | Status: AC
  Administered 2022-09-20: 20 mg via INTRAVENOUS
  Filled 2022-09-20: qty 50

## 2022-09-20 NOTE — MAU Note (Signed)
Jenny Barnes is a 27 y.o. at [redacted]w[redacted]d here in MAU reporting: her dr sent her in.  Has been throwing up since last night, unable to keep anything down. Denies fever or diarrhea. Intermittent pain RLQ on the side.  No bleeding or LOF, reports +FM.   Onset of complaint: last night Pain score: 4 Vitals:   09/20/22 1021  BP: 105/65  Pulse: 91  Resp: 17  Temp: 98.4 F (36.9 C)  SpO2: 99%     FHT:154 Lab orders placed from triage:  urine

## 2022-09-20 NOTE — MAU Provider Note (Signed)
History     CSN: [id number]  Arrival date and time: 09/20/22 1008   Event Date/Time   First Provider Initiated Contact with Patient 09/20/22 1042      Chief Complaint  Patient presents with   Emesis   Abdominal Pain   HPI [name] is a 27 y.o. year old G71P2002 female at [redacted]w[redacted]d weeks gestation who presents to MAU reporting she has been vomiting since last night at about 2200. She reports she has thrown up a total of 3 times since it started and she is unable to keep anything down. She recalls eating BBQ chicken and pineapple that she cooked for dinner last night. This morning she took Pepcid, Prilosec and Zofran at 0815, but threw them up after eating a pancake at 0830. She also reports intermittent RLQ pain. She denies any diarrhea, fever, any sick contacts, VB or LOF. She reports (+) FM. She states she has been having "very irregular cramping like mild UC's." She receives [facility] with [facility]. She reports when she called the office to report her sx's she thought they would have her come in the office to have her membranes stripped so she "can just have this baby." Instead the office told her to come MAU for evaluation.   OB History     Gravida  3   Para  2   Term  2   Preterm      AB      Living  2      SAB      IAB      Ectopic      Multiple  0   Live Births  2           Past Medical History:  Diagnosis Date   Hypothyroidism    Kidney stone    August 2021   Thyroid disease     Past Surgical History:  Procedure Laterality Date   CYSTOSCOPY W/ URETERAL STENT REMOVAL     CYSTOSCOPY WITH RETROGRADE PYELOGRAM, URETEROSCOPY AND STENT PLACEMENT Right 04/09/2020   Procedure: URETEROSCOPY, BASKETING OF STONE & DOUBLE J. STENT PLACEMENT;  Surgeon: Alexis Frock, MD;  Location: [place];  Service: Urology;  Laterality: Right;   WISDOM TOOTH EXTRACTION      Family History  Problem Relation Age of Onset   Kidney Stones Mother    Kidney  Stones Sister     Social History   Tobacco Use   Smoking status: Never   Smokeless tobacco: Never  Vaping Use   Vaping Use: Never used  Substance Use Topics   Alcohol use: No   Drug use: Never    Allergies: No Known Allergies  No medications prior to admission.    Review of Systems  Constitutional:  Positive for appetite change (" I don't want to eat anything else since eating or drinking makes me vomit.").  HENT: Negative.    Eyes: Negative.   Respiratory: Negative.    Cardiovascular: Negative.   Gastrointestinal:  Positive for abdominal pain, nausea and vomiting. Negative for constipation and diarrhea.  Endocrine: Negative.   Genitourinary:  Positive for pelvic pain ("very irregular UC's - more like cramping").  Musculoskeletal: Negative.   Skin: Negative.   Allergic/Immunologic: Negative.   Neurological: Negative.   Hematological: Negative.   Psychiatric/Behavioral: Negative.     Physical Exam   Patient Vitals for the past 24 hrs:  BP Temp Temp src Pulse Resp SpO2 Height Weight  09/20/22 1208 (!) 94/56 -- --  83 17 -- -- --  09/20/22 1037 98/69 -- -- (!) 112 -- 100 % -- --  09/20/22 1021 105/65 98.4 F (36.9 C) Oral 91 17 99 % 5\' 4"  (1.626 m) 70.9 kg     Physical Exam Vitals and nursing note reviewed.  Constitutional:      Appearance: Normal appearance. She is normal weight.  Cardiovascular:     Rate and Rhythm: Normal rate and regular rhythm.     Pulses: Normal pulses.     Heart sounds: Normal heart sounds.  Pulmonary:     Effort: Pulmonary effort is normal.     Breath sounds: Normal breath sounds.  Abdominal:     General: Bowel sounds are decreased.     Palpations: Abdomen is soft.     Tenderness: There is no abdominal tenderness.     Comments: gravid  Genitourinary:    Comments: Not indicated Musculoskeletal:        General: Normal range of motion.  Skin:    General: Skin is warm and dry.  Neurological:     Mental Status: She is alert and  oriented to person, place, and time.  Psychiatric:        Mood and Affect: Mood normal.        Behavior: Behavior normal.        Thought Content: Thought content normal.        Judgment: Judgment normal.    Reassessment @ 1200: Patient states she no longer wants to receive the 2nd bag of IVFs with MVI, "because I have childcare issues and cannot stay here any longer. I feel much better after receiving that medicine for nausea."  MAU Course  Procedures  MDM CCUA CBC CMP IVFs: LR 1000 ml @ 999 ml/hr  Compazine 10 mg  IVPB  -- no nausea/vomiting "feels better" Pepcid 20 mg IVPB MVI 1 ampule in LR 1000 ml @ 999 ml/hr - DECLINED AFTER COMPAZINE PO Challenge -- DECLINED d/t childcare issues  Results for orders placed or performed during the hospital encounter of 09/20/22 (from the past 24 hour(s))  Urinalysis, Routine w reflex microscopic -Urine, Clean Catch     Status: Abnormal   Collection Time: 09/20/22 10:40 AM  Result Value Ref Range   Color, Urine YELLOW YELLOW   APPearance HAZY (A) CLEAR   Specific Gravity, Urine 1.009 1.005 - 1.030   pH 7.0 5.0 - 8.0   Glucose, UA NEGATIVE NEGATIVE mg/dL   Hgb urine dipstick NEGATIVE NEGATIVE   Bilirubin Urine NEGATIVE NEGATIVE   Ketones, ur NEGATIVE NEGATIVE mg/dL   Protein, ur NEGATIVE NEGATIVE mg/dL   Nitrite NEGATIVE NEGATIVE   Leukocytes,Ua MODERATE (A) NEGATIVE   RBC / HPF 0-5 0 - 5 RBC/hpf   WBC, UA 0-5 0 - 5 WBC/hpf   Bacteria, UA FEW (A) NONE SEEN   Squamous Epithelial / HPF 21-50 0 - 5 /HPF   Mucus PRESENT   CBC     Status: Abnormal   Collection Time: 09/20/22 11:06 AM  Result Value Ref Range   WBC 7.3 4.0 - 10.5 K/uL   RBC 3.30 (L) 3.87 - 5.11 MIL/uL   Hemoglobin 9.8 (L) 12.0 - 15.0 g/dL   HCT 09/22/22 (L) 16.0 - 10.9 %   MCV 90.3 80.0 - 100.0 fL   MCH 29.7 26.0 - 34.0 pg   MCHC 32.9 30.0 - 36.0 g/dL   RDW 32.3 55.7 - 32.2 %   Platelets 221 150 - 400 K/uL   nRBC 0.0  0.0 - 0.2 %  Comprehensive metabolic panel      Status: Abnormal   Collection Time: 09/20/22 11:06 AM  Result Value Ref Range   Sodium 135 135 - 145 mmol/L   Potassium 3.9 3.5 - 5.1 mmol/L   Chloride 103 98 - 111 mmol/L   CO2 24 22 - 32 mmol/L   Glucose, Bld 77 70 - 99 mg/dL   BUN 12 6 - 20 mg/dL   Creatinine, Ser 0.69 0.44 - 1.00 mg/dL   Calcium 9.1 8.9 - 10.3 mg/dL   Total Protein 6.3 (L) 6.5 - 8.1 g/dL   Albumin 3.0 (L) 3.5 - 5.0 g/dL   AST 14 (L) 15 - 41 U/L   ALT 7 0 - 44 U/L   Alkaline Phosphatase 69 38 - 126 U/L   Total Bilirubin 0.5 0.3 - 1.2 mg/dL   GFR, Estimated >60 >60 mL/min   Anion gap 8 5 - 15    Assessment and Plan  1. Nausea and vomiting, unspecified vomiting type - Advised to stay well-hydrated today - Information provided on bland diet   2. [redacted] weeks gestation of pregnancy  - Discharge patient - Keep scheduled appt with [facility] - Patient verbalized an understanding of the plan of care and agrees.    Laury Deep, CNM 09/20/2022, 10:42 AM

## 2022-09-22 ENCOUNTER — Other Ambulatory Visit (HOSPITAL_COMMUNITY): Payer: Self-pay | Admitting: Advanced Practice Midwife

## 2022-09-25 ENCOUNTER — Telehealth (HOSPITAL_COMMUNITY): Payer: Self-pay | Admitting: *Deleted

## 2022-09-25 NOTE — Telephone Encounter (Signed)
Preadmission screen  

## 2022-09-26 ENCOUNTER — Telehealth (HOSPITAL_COMMUNITY): Payer: Self-pay | Admitting: *Deleted

## 2022-09-26 ENCOUNTER — Encounter (HOSPITAL_COMMUNITY): Payer: Self-pay | Admitting: *Deleted

## 2022-09-26 NOTE — Telephone Encounter (Signed)
Preadmission screen  

## 2022-09-27 ENCOUNTER — Other Ambulatory Visit: Payer: Self-pay | Admitting: Advanced Practice Midwife

## 2022-10-01 ENCOUNTER — Other Ambulatory Visit: Payer: Self-pay | Admitting: Advanced Practice Midwife

## 2022-10-02 ENCOUNTER — Inpatient Hospital Stay (HOSPITAL_COMMUNITY): Payer: Medicaid Other

## 2022-10-02 ENCOUNTER — Inpatient Hospital Stay (HOSPITAL_COMMUNITY)
Admission: RE | Admit: 2022-10-02 | Discharge: 2022-10-04 | DRG: 807 | Disposition: A | Discharge status: Home / Self Care | Payer: Medicaid Other | Attending: Obstetrics | Admitting: Obstetrics

## 2022-10-02 ENCOUNTER — Encounter (HOSPITAL_COMMUNITY): Payer: Self-pay | Admitting: Obstetrics

## 2022-10-02 DIAGNOSIS — Z87442 Personal history of urinary calculi: Secondary | ICD-10-CM | POA: Diagnosis not present

## 2022-10-02 DIAGNOSIS — E039 Hypothyroidism, unspecified: Secondary | ICD-10-CM | POA: Diagnosis present

## 2022-10-02 DIAGNOSIS — O99824 Streptococcus B carrier state complicating childbirth: Secondary | ICD-10-CM | POA: Diagnosis not present

## 2022-10-02 DIAGNOSIS — Z3A40 40 weeks gestation of pregnancy: Secondary | ICD-10-CM

## 2022-10-02 DIAGNOSIS — O480 Post-term pregnancy: Secondary | ICD-10-CM | POA: Diagnosis not present

## 2022-10-02 DIAGNOSIS — O99284 Endocrine, nutritional and metabolic diseases complicating childbirth: Secondary | ICD-10-CM | POA: Diagnosis not present

## 2022-10-02 LAB — CBC
HCT: 31.7 % — ABNORMAL LOW (ref 36.0–46.0)
Hemoglobin: 10.1 g/dL — ABNORMAL LOW (ref 12.0–15.0)
MCH: 29 pg (ref 26.0–34.0)
MCHC: 31.9 g/dL (ref 30.0–36.0)
MCV: 91.1 fL (ref 80.0–100.0)
Platelets: 246 10*3/uL (ref 150–400)
RBC: 3.48 MIL/uL — ABNORMAL LOW (ref 3.87–5.11)
RDW: 13.4 % (ref 11.5–15.5)
WBC: 7 10*3/uL (ref 4.0–10.5)
nRBC: 0 % (ref 0.0–0.2)

## 2022-10-02 LAB — TYPE AND SCREEN
ABO/RH(D): B POS
Antibody Screen: NEGATIVE

## 2022-10-02 LAB — RPR: RPR Ser Ql: NONREACTIVE

## 2022-10-02 MED ORDER — OXYCODONE HCL 5 MG PO TABS: 5.0000 mg | ORAL_TABLET | ORAL | Status: DC | PRN

## 2022-10-02 MED ORDER — FENTANYL-BUPIVACAINE-NACL 0.5-0.125-0.9 MG/250ML-% EP SOLN: 12.0000 mL/h | EPIDURAL | Status: DC | PRN

## 2022-10-02 MED ORDER — COCONUT OIL OIL
1.0000 | TOPICAL_OIL | Status: DC | PRN
  Administered 2022-10-03: 1 via TOPICAL

## 2022-10-02 MED ORDER — ONDANSETRON HCL 4 MG PO TABS
4.0000 mg | ORAL_TABLET | ORAL | Status: DC | PRN
  Administered 2022-10-03: 4 mg via ORAL
  Filled 2022-10-02: qty 1

## 2022-10-02 MED ORDER — PENICILLIN G POT IN DEXTROSE 60000 UNIT/ML IV SOLN
3.0000 10*6.[IU] | INTRAVENOUS | Status: DC
  Administered 2022-10-02: 3 10*6.[IU] via INTRAVENOUS
  Filled 2022-10-02: qty 50

## 2022-10-02 MED ORDER — OXYTOCIN-SODIUM CHLORIDE 30-0.9 UT/500ML-% IV SOLN
2.5000 [IU]/h | INTRAVENOUS | Status: DC
  Filled 2022-10-02: qty 500

## 2022-10-02 MED ORDER — ONDANSETRON HCL 4 MG/2ML IJ SOLN: 4.0000 mg | INTRAMUSCULAR | Status: DC | PRN

## 2022-10-02 MED ORDER — ACETAMINOPHEN 325 MG PO TABS: 650.0000 mg | ORAL_TABLET | ORAL | Status: DC | PRN

## 2022-10-02 MED ORDER — SENNOSIDES-DOCUSATE SODIUM 8.6-50 MG PO TABS
2.0000 | ORAL_TABLET | ORAL | Status: DC
  Administered 2022-10-02 – 2022-10-04 (×3): 2 via ORAL
  Filled 2022-10-02 (×3): qty 2

## 2022-10-02 MED ORDER — LACTATED RINGERS IV SOLN: 500.0000 mL | INTRAVENOUS | Status: DC | PRN

## 2022-10-02 MED ORDER — OXYTOCIN BOLUS FROM INFUSION: 333.0000 mL | Freq: Once | INTRAVENOUS | Status: DC

## 2022-10-02 MED ORDER — SOD CITRATE-CITRIC ACID 500-334 MG/5ML PO SOLN: 30.0000 mL | ORAL | Status: DC | PRN

## 2022-10-02 MED ORDER — SODIUM CHLORIDE 0.9 % IV SOLN
5.0000 10*6.[IU] | Freq: Once | INTRAVENOUS | Status: AC
  Administered 2022-10-02: 5 10*6.[IU] via INTRAVENOUS
  Filled 2022-10-02: qty 5

## 2022-10-02 MED ORDER — DIPHENHYDRAMINE HCL 50 MG/ML IJ SOLN: 12.5000 mg | INTRAMUSCULAR | Status: DC | PRN

## 2022-10-02 MED ORDER — PHENYLEPHRINE 80 MCG/ML (10ML) SYRINGE FOR IV PUSH (FOR BLOOD PRESSURE SUPPORT): 80.0000 ug | PREFILLED_SYRINGE | INTRAVENOUS | Status: DC | PRN

## 2022-10-02 MED ORDER — OXYTOCIN-SODIUM CHLORIDE 30-0.9 UT/500ML-% IV SOLN: 1.0000 m[IU]/min | INTRAVENOUS | Status: DC

## 2022-10-02 MED ORDER — CALCIUM CARBONATE ANTACID 500 MG PO CHEW: 2.0000 | CHEWABLE_TABLET | Freq: Three times a day (TID) | ORAL | Status: DC | PRN

## 2022-10-02 MED ORDER — SIMETHICONE 80 MG PO CHEW: 80.0000 mg | CHEWABLE_TABLET | ORAL | Status: DC | PRN

## 2022-10-02 MED ORDER — BENZOCAINE-MENTHOL 20-0.5 % EX AERO
1.0000 | INHALATION_SPRAY | CUTANEOUS | Status: DC | PRN
  Administered 2022-10-02: 1 via TOPICAL
  Filled 2022-10-02: qty 56

## 2022-10-02 MED ORDER — DIBUCAINE (PERIANAL) 1 % EX OINT: 1.0000 | TOPICAL_OINTMENT | CUTANEOUS | Status: DC | PRN

## 2022-10-02 MED ORDER — WITCH HAZEL-GLYCERIN EX PADS
1.0000 | MEDICATED_PAD | CUTANEOUS | Status: DC | PRN
  Administered 2022-10-02: 1 via TOPICAL

## 2022-10-02 MED ORDER — TETANUS-DIPHTH-ACELL PERTUSSIS 5-2.5-18.5 LF-MCG/0.5 IM SUSY: 0.5000 mL | PREFILLED_SYRINGE | Freq: Once | INTRAMUSCULAR | Status: DC

## 2022-10-02 MED ORDER — OXYCODONE-ACETAMINOPHEN 5-325 MG PO TABS: 1.0000 | ORAL_TABLET | ORAL | Status: DC | PRN

## 2022-10-02 MED ORDER — PANTOPRAZOLE SODIUM 40 MG PO TBEC
40.0000 mg | DELAYED_RELEASE_TABLET | Freq: Every day | ORAL | Status: DC
  Administered 2022-10-02 – 2022-10-04 (×3): 40 mg via ORAL
  Filled 2022-10-02 (×3): qty 1

## 2022-10-02 MED ORDER — FENTANYL CITRATE (PF) 100 MCG/2ML IJ SOLN
50.0000 ug | INTRAMUSCULAR | Status: DC | PRN
  Administered 2022-10-02: 100 ug via INTRAVENOUS
  Filled 2022-10-02: qty 2

## 2022-10-02 MED ORDER — PRENATAL MULTIVITAMIN CH
1.0000 | ORAL_TABLET | Freq: Every day | ORAL | Status: DC
  Administered 2022-10-04: 1 via ORAL
  Filled 2022-10-02: qty 1

## 2022-10-02 MED ORDER — EPHEDRINE 5 MG/ML INJ: 10.0000 mg | INTRAVENOUS | Status: DC | PRN

## 2022-10-02 MED ORDER — TERBUTALINE SULFATE 1 MG/ML IJ SOLN: 0.2500 mg | Freq: Once | INTRAMUSCULAR | Status: DC | PRN

## 2022-10-02 MED ORDER — LEVOTHYROXINE SODIUM 75 MCG PO TABS
150.0000 ug | ORAL_TABLET | Freq: Every day | ORAL | Status: DC
  Administered 2022-10-02 – 2022-10-04 (×3): 150 ug via ORAL
  Filled 2022-10-02: qty 1
  Filled 2022-10-02 (×2): qty 2

## 2022-10-02 MED ORDER — LACTATED RINGERS IV SOLN: 500.0000 mL | Freq: Once | INTRAVENOUS | Status: DC

## 2022-10-02 MED ORDER — ONDANSETRON HCL 4 MG/2ML IJ SOLN: 4.0000 mg | Freq: Four times a day (QID) | INTRAMUSCULAR | Status: DC | PRN

## 2022-10-02 MED ORDER — LACTATED RINGERS IV SOLN: INTRAVENOUS | Status: DC

## 2022-10-02 MED ORDER — DIPHENHYDRAMINE HCL 25 MG PO CAPS: 25.0000 mg | ORAL_CAPSULE | Freq: Four times a day (QID) | ORAL | Status: DC | PRN

## 2022-10-02 MED ORDER — IBUPROFEN 600 MG PO TABS
600.0000 mg | ORAL_TABLET | Freq: Four times a day (QID) | ORAL | Status: DC
  Administered 2022-10-02 – 2022-10-04 (×8): 600 mg via ORAL
  Filled 2022-10-02 (×8): qty 1

## 2022-10-02 MED ORDER — LIDOCAINE HCL (PF) 1 % IJ SOLN: 30.0000 mL | INTRAMUSCULAR | Status: DC | PRN

## 2022-10-02 MED ORDER — OXYCODONE HCL 5 MG PO TABS: 10.0000 mg | ORAL_TABLET | ORAL | Status: DC | PRN

## 2022-10-02 MED ORDER — OXYTOCIN-SODIUM CHLORIDE 30-0.9 UT/500ML-% IV SOLN
1.0000 m[IU]/min | INTRAVENOUS | Status: DC
  Administered 2022-10-02: 1 m[IU]/min via INTRAVENOUS

## 2022-10-02 MED ORDER — OXYCODONE-ACETAMINOPHEN 5-325 MG PO TABS: 2.0000 | ORAL_TABLET | ORAL | Status: DC | PRN

## 2022-10-02 NOTE — Plan of Care (Signed)
Conda Wannamaker, RN 

## 2022-10-02 NOTE — H&P (Signed)
27 y.o. G6Y4034 @ [redacted]w[redacted]d presents for induction of labor for post term pregnancy.  Otherwise has good fetal movement and no bleeding.  Pregnancy complicated by: Hypothyroidism: on synthroid 150ug daily  Past Medical History:  Diagnosis Date   Hypothyroidism    Kidney stone    August 2021   Thyroid disease     Past Surgical History:  Procedure Laterality Date   CYSTOSCOPY W/ URETERAL STENT REMOVAL     CYSTOSCOPY WITH RETROGRADE PYELOGRAM, URETEROSCOPY AND STENT PLACEMENT Right 04/09/2020   Procedure: URETEROSCOPY, BASKETING OF STONE & DOUBLE J. STENT PLACEMENT;  Surgeon: Alexis Frock, MD;  Location: [facility];  Service: Urology;  Laterality: Right;   WISDOM TOOTH EXTRACTION      OB History  Gravida Para Term Preterm AB Living  3 2 2     2   SAB IAB Ectopic Multiple Live Births        0 2    # Outcome Date GA Lbr Len/2nd Weight Sex Delivery Anes PTL Lv  3 Current           2 Term 06/14/20 [redacted]w[redacted]d 11:44 / 00:20 2895 g F Vag-Spont EPI  LIV  1 Term 09/19/18 [redacted]w[redacted]d 06:50 / 00:29 3120 g M Vag-Spont EPI  LIV    Social History   Socioeconomic History   Marital status: Married    Spouse name: Liane Comber   Number of children: Not on file   Years of education: Not on file   Highest education level: Not on file  Occupational History   Not on file  Tobacco Use   Smoking status: Never   Smokeless tobacco: Never  Vaping Use   Vaping Use: Never used  Substance and Sexual Activity   Alcohol use: No   Drug use: Never   Sexual activity: Yes    Birth control/protection: None  Other Topics Concern   Not on file  Social History Narrative   Not on file   Social Determinants of Health   Financial Resource Strain: Not on file  Food Insecurity: No Food Insecurity (10/02/2022)   Hunger Vital Sign    Worried About Running Out of Food in the Last Year: Never true    Ran Out of Food in the Last Year: Never true  Transportation Needs: No Transportation Needs (10/02/2022)   PRAPARE - Armed forces logistics/support/administrative officer (Medical): No    Lack of Transportation (Non-Medical): No  Physical Activity: Not on file  Stress: Not on file  Social Connections: Not on file  Intimate Partner Violence: Unknown (10/02/2022)   Humiliation, Afraid, Rape, and Kick questionnaire    Fear of Current or Ex-Partner: No    Emotionally Abused: No    Physically Abused: No    Sexually Abused: Not on file   Patient has no known allergies.    Prenatal Transfer Tool  Maternal Diabetes: No Genetic Screening: Normal Maternal Ultrasounds/Referrals: Isolated choroid plexus cyst but low risk NIPT Fetal Ultrasounds or other Referrals:  None Maternal Substance Abuse:  No Significant Maternal Medications:  Meds include: Other:  synthroid Significant Maternal Lab Results: Group B Strep positive  ABO, Rh: --/--/B POS (02/05 7425) Antibody: NEG (02/05 0652) Rubella: Immune (06/27 0000) RPR: Nonreactive (06/27 0000)  HBsAg: Negative (06/27 0000)  HIV: Non-reactive (06/27 0000)  GBS: Positive/-- (01/15 0000)    Vitals:   10/02/22 0703 10/02/22 0803  BP: 111/63 (!) 105/59  Pulse: 96 86  Resp: 16 16  Temp: 98.1 F (36.7 C)  General:  NAD Abdomen:  soft, gravid, EFW 6.5-7# Ex:  no edema SVE:  1/60/-2, Foley balloon placed and filled with 60 mL fluid. SROM occurred during filling of balloon, clear fluid FHTs:  120s, moderate variability, + accelerations, no decels Toco:  irregular   A/P   27 y.o. [id number] [redacted]w[redacted]d presents for IOL for post-term pregnancy IOL: foley balloon + low dose pitocin.  SROM after balloon placement, will leave in situ Hypothyroidism: continue synthroid GBS positive--pcn  [place]

## 2022-10-02 NOTE — Lactation Note (Signed)
This note was copied from a baby's chart. Lactation Consultation Note  Patient Name: Jenny Barnes Today's Date: 10/02/2022 Reason for consult: Initial assessment;Breastfeeding assistance Age:25 hours  Baby was a sleepy feeder, latched and sucked for about 2 minutes and fell asleep, 3 latching attempts tried in cross cradle hold on left breast. Birth parent will continue to latch baby on breast and will ask for assistance from [place] if needed. Birth parent will continue to breastfeed infant according to hunger cues 8-12 x within 24 hrs STS. Infant input and output was discussed. [facility] student discussed with birth parent, community resources such as [place] hotline, breast feeding support group in person and virtually, and LC out patient.   Maternal Data Has patient been taught Hand Expression?: Yes ([place] student taught birthing parent hand expression) Does the patient have breastfeeding experience prior to this delivery?: Yes (painful latch with first, and low milk supply. Second child baby had a lip tie and did not latch.)  Feeding Mother's Current Feeding Choice: Breast Milk  LATCH Score Latch: Too sleepy or reluctant, no latch achieved, no sucking elicited.  Audible Swallowing: None  Type of Nipple: Everted at rest and after stimulation  Comfort (Breast/Nipple): Soft / non-tender  Hold (Positioning): Assistance needed to correctly position infant at breast and maintain latch.  LATCH Score: 5   Lactation Tools Discussed/Used    Interventions Interventions: Breast feeding basics reviewed;Assisted with latch;Skin to skin;Breast massage;Hand express;Breast compression;Adjust position;Support pillows;Position options;Education;LC Services brochure  Discharge Pump: DEBP;Personal;Hands Free  Consult Status Consult Status: Follow-up Date: 10/03/22 Follow-up type: In-patient    Debara Pickett Lactation Student  10/02/2022, 6:40 PM

## 2022-10-02 NOTE — Progress Notes (Signed)
Foley balloon out about 1.5 hours ago.  Contractions increasing in intensity  BP 114/71   Pulse 85   Temp (!) 97.5 F (36.4 C) (Oral)   Resp 16   Toco: q2-4 minutes EFM:120s, moderate variability, + accels, no decels SVE: 5/80/-2  A/P:  G3P2 @ [redacted]w[redacted]d with IOL for post-term pregnancy Continue pitocin, progressing well Anticipate SVD GBS positive

## 2022-10-03 LAB — CBC
HCT: 30.3 % — ABNORMAL LOW (ref 36.0–46.0)
Hemoglobin: 9.7 g/dL — ABNORMAL LOW (ref 12.0–15.0)
MCH: 28.9 pg (ref 26.0–34.0)
MCHC: 32 g/dL (ref 30.0–36.0)
MCV: 90.2 fL (ref 80.0–100.0)
Platelets: 260 10*3/uL (ref 150–400)
RBC: 3.36 MIL/uL — ABNORMAL LOW (ref 3.87–5.11)
RDW: 13.4 % (ref 11.5–15.5)
WBC: 11.9 10*3/uL — ABNORMAL HIGH (ref 4.0–10.5)
nRBC: 0 % (ref 0.0–0.2)

## 2022-10-03 MED ORDER — IBUPROFEN 600 MG PO TABS: 600.0000 mg | ORAL_TABLET | Freq: Four times a day (QID) | ORAL | 1 refills | Status: AC | PRN

## 2022-10-03 NOTE — Discharge Summary (Signed)
Postpartum Discharge Summary  Date of Service updated      Patient Name: Jenny Barnes DOB: 05-Apr-1996 MRN: [id number]  Date of admission: 10/02/2022 Delivery date:10/02/2022  Delivering provider: Jerelyn Charles  Date of discharge: 10/03/2022  Admitting diagnosis: Post term pregnancy [O48.0] Intrauterine pregnancy: [redacted]w[redacted]d     Secondary diagnosis:  Principal Problem:   Post term pregnancy  Additional problems: none    Discharge diagnosis: Term Pregnancy Delivered                                              Post partum procedures: n/a Augmentation: Pitocin and IP Foley Complications: None  Hospital course: Induction of Labor With Vaginal Delivery   27 y.o. yo G3P3003 at [redacted]w[redacted]d was admitted to the hospital 10/02/2022 for induction of labor.  Indication for induction: Postdates.  Patient had an labor course complicated byn/a Membrane Rupture Time/Date: 7:50 AM ,10/02/2022   Delivery Method:Vaginal, Spontaneous  Episiotomy: None  Lacerations:  None  Details of delivery can be found in separate delivery note.  Patient had a postpartum course complicated byn/a. Patient is discharged home 10/03/22.  Newborn Data: Birth date:10/02/2022  Birth time:1:27 PM  Gender:Female  Living status:Living  Apgars:9 ,9  Weight:3370 g   Magnesium Sulfate received: No BMZ received: No  Physical exam  Vitals:   10/02/22 1650 10/02/22 2107 10/03/22 0111 10/03/22 0415  BP: 104/72 114/81 107/67   Pulse: 79 66 84   Resp: 16 18 18    Temp: 98.5 F (36.9 C) 97.6 F (36.4 C) 97.8 F (36.6 C)   TempSrc: Oral Oral Oral Oral  SpO2: 100% 100% 99%    General: alert, cooperative, and no distress Lochia: appropriate Uterine Fundus: firm Incision: N/A DVT Evaluation: No evidence of DVT seen on physical exam. No significant calf/ankle edema. Labs: Lab Results  Component Value Date   WBC 11.9 (H) 10/03/2022   HGB 9.7 (L) 10/03/2022   HCT 30.3 (L) 10/03/2022   MCV 90.2 10/03/2022   PLT 260  10/03/2022      Latest Ref Rng & Units 09/20/2022   11:06 AM  CMP  Glucose 70 - 99 mg/dL 77   BUN 6 - 20 mg/dL 12   Creatinine 0.44 - 1.00 mg/dL 0.69   Sodium 135 - 145 mmol/L 135   Potassium 3.5 - 5.1 mmol/L 3.9   Chloride 98 - 111 mmol/L 103   CO2 22 - 32 mmol/L 24   Calcium 8.9 - 10.3 mg/dL 9.1   Total Protein 6.5 - 8.1 g/dL 6.3   Total Bilirubin 0.3 - 1.2 mg/dL 0.5   Alkaline Phos 38 - 126 U/L 69   AST 15 - 41 U/L 14   ALT 0 - 44 U/L 7    Edinburgh Score:    10/02/2022    3:33 PM  Edinburgh Postnatal Depression Scale Screening Tool  I have been able to laugh and see the funny side of things. 0  I have looked forward with enjoyment to things. 0  I have blamed myself unnecessarily when things went wrong. 1  I have been anxious or worried for no good reason. 0  I have felt scared or panicky for no good reason. 0  Things have been getting on top of me. 1  I have been so unhappy that I have had difficulty sleeping. 0  I have  felt sad or miserable. 0  I have been so unhappy that I have been crying. 0  The thought of harming myself has occurred to me. 0  Edinburgh Postnatal Depression Scale Total 2      After visit meds:  Allergies as of 10/03/2022   No Known Allergies      Medication List     TAKE these medications    acetaminophen 500 MG tablet Commonly known as: TYLENOL Take 2 tablets (1,000 mg total) by mouth every 8 (eight) hours.   famotidine 20 MG tablet Commonly known as: PEPCID Take 20 mg by mouth 2 (two) times daily.   ibuprofen 600 MG tablet Commonly known as: ADVIL Take 1 tablet (600 mg total) by mouth every 6 (six) hours as needed for moderate pain or cramping.   levothyroxine 200 MCG tablet Commonly known as: SYNTHROID Take 225 mcg by mouth daily before breakfast.   omeprazole 10 MG capsule Commonly known as: PRILOSEC Take 10 mg by mouth daily.   ondansetron 4 MG tablet Commonly known as: ZOFRAN Take 4 mg by mouth every 8 (eight) hours as  needed for nausea or vomiting.   prenatal multivitamin Tabs tablet Take 1 tablet by mouth daily at 12 noon.   promethazine 25 MG tablet Commonly known as: PHENERGAN Take 1 tablet (25 mg total) by mouth every 6 (six) hours as needed for nausea or vomiting.         Discharge home in stable condition Infant Feeding: Breast Infant Disposition:home with mother Discharge instruction: per After Visit Summary and Postpartum booklet. Activity: Advance as tolerated. Pelvic rest for 6 weeks.  Diet: routine diet Anticipated Birth Control: Unsure Postpartum Appointment:6 weeks Additional Postpartum F/U: Postpartum Depression checkup Future Appointments:No future appointments. Follow up Visit:  Follow-up Information     Ob/Gyn, [facility]. Schedule an appointment as soon as possible for a visit in 6 week(s).   Why: For 6 weeks postpartum visit Contact information: [place] [address] [phone]                     10/03/2022 Isaiah Serge, DO  Discharged suspended yesterday d/t baby staying.  Mom reports no changes or complaints since yesterday.  Will proceed with discharge today.

## 2022-10-03 NOTE — Progress Notes (Signed)
Post Partum Day 1 Subjective: up ad lib, voiding, tolerating PO, + flatus, and lochia mild. Pt reports fatigue due to baby cluster feeding overnight. Bonding well otherwise. She denies fever, chills, SOB or CP. Would like discharge to home today if baby cleared  Objective: Blood pressure 107/67, pulse 84, temperature 97.8 F (36.6 C), temperature source Oral, resp. rate 18, SpO2 99 %, unknown if currently breastfeeding.  Physical Exam:  General: alert, cooperative, and no distress Lochia: appropriate Uterine Fundus: firm Incision: n/a DVT Evaluation: No evidence of DVT seen on physical exam. No significant calf/ankle edema.  Recent Labs    10/02/22 0638 10/03/22 0544  HGB 10.1* 9.7*  HCT 31.7* 30.3*    Assessment/Plan: Discharge home and Breastfeeding Instructions reviewed    LOS: 1 day   Lakin Romer W Prestina Raigoza, DO 10/03/2022, 11:25 AM

## 2022-10-03 NOTE — Social Work (Signed)
MOB was referred for history of depression.  * Referral screened out by Clinical Social Worker because none of the following criteria appear to apply:  ~ History of anxiety/depression during this pregnancy, or of post-partum depression following prior delivery.  ~ Diagnosis of anxiety and/or depression within last 3 years OR * MOB's symptoms currently being treated with medication and/or therapy.  Per chart review MOB diagnosed on 2021, No noted concerns during pregnancy.  Please contact the Clinical Social Worker if needs arise or by MOB request.  Thanh Pomerleau, LCSWA Clinical Social Worker [phone] 

## 2022-10-03 NOTE — Discharge Instructions (Signed)
Call office with any concerns [phone] 

## 2022-10-04 NOTE — Lactation Note (Signed)
This note was copied from a baby's chart. Lactation Consultation Note  Patient Name: Jenny Barnes [id number] Date: 10/04/2022 Reason for consult: Follow-up assessment;Term Age:25 hours   P3: Term infant at 40+2 weeks Feeding preference: Breast Weight loss: 8%   Mother had some questions related to breast feeding and pumping; answered questions to her satisfaction.  Reviewed feeding plan for after discharge.  Mother will continue to breast feed and supplement; reviewed volumes of 30+ mls/feeding session.  Provided a manual pump with instructions for use.  #21 flange size is appropriate at this time.  Family has our op phone number for any further questions/concerns.  Father present.  RN updated and family will be discharged.    Maternal Data    Feeding    LATCH Score                    Lactation Tools Discussed/Used    Interventions Interventions: Education;Breast feeding basics reviewed  Discharge Discharge Education: Engorgement and breast care Pump: Personal  Consult Status Consult Status: Complete Date: 10/04/22 Follow-up type: Call as needed    Lanice Schwab Jenny Barnes 10/04/2022, 12:37 PM

## 2022-10-04 NOTE — Lactation Note (Signed)
This note was copied from a baby's chart. Lactation Consultation Note  Patient Name: Jenny Barnes Today's Date: 10/04/2022   Age:25 hours   LC Note:  Attempted to visit with mother, however, all family members were asleep at this time.   Maternal Data    Feeding Nipple Type: Slow - flow  LATCH Score                    Lactation Tools Discussed/Used    Interventions    Discharge    Consult Status      Vercie Pokorny R Marianita Botkin 10/04/2022, 7:58 AM

## 2022-10-04 NOTE — Lactation Note (Signed)
This note was copied from a baby's chart. Lactation Consultation Note  Patient Name: Jenny Barnes Today's Date: 10/04/2022   Age:25 hours Attempted to see mom but she was in bathroom. Maternal Data    Feeding Nipple Type: Slow - flow  LATCH Score                    Lactation Tools Discussed/Used    Interventions    Discharge    Consult Status      Theodoro Kalata 10/04/2022, 5:39 AM

## 2022-10-12 ENCOUNTER — Telehealth (HOSPITAL_COMMUNITY): Payer: Self-pay | Admitting: *Deleted

## 2022-10-12 NOTE — Telephone Encounter (Signed)
Attempted hospital discharge follow-up call. Left message for patient to return RN call with any questions or concerns. [name], RN, 10/12/22, [phone]

## 2022-11-06 DIAGNOSIS — E039 Hypothyroidism, unspecified: Secondary | ICD-10-CM | POA: Diagnosis not present

## 2022-11-07 DIAGNOSIS — E890 Postprocedural hypothyroidism: Secondary | ICD-10-CM | POA: Diagnosis not present

## 2022-11-07 DIAGNOSIS — R21 Rash and other nonspecific skin eruption: Secondary | ICD-10-CM | POA: Diagnosis not present

## 2022-11-07 DIAGNOSIS — F419 Anxiety disorder, unspecified: Secondary | ICD-10-CM | POA: Diagnosis not present

## 2022-11-07 DIAGNOSIS — F410 Panic disorder [episodic paroxysmal anxiety] without agoraphobia: Secondary | ICD-10-CM | POA: Diagnosis not present

## 2022-11-08 ENCOUNTER — Other Ambulatory Visit: Payer: Self-pay

## 2022-11-08 ENCOUNTER — Ambulatory Visit: Payer: Medicaid Other | Attending: Obstetrics

## 2022-11-08 DIAGNOSIS — M5459 Other low back pain: Secondary | ICD-10-CM | POA: Diagnosis not present

## 2022-11-08 DIAGNOSIS — N393 Stress incontinence (female) (male): Secondary | ICD-10-CM | POA: Diagnosis not present

## 2022-11-08 DIAGNOSIS — N3946 Mixed incontinence: Secondary | ICD-10-CM | POA: Insufficient documentation

## 2022-11-08 DIAGNOSIS — M62838 Other muscle spasm: Secondary | ICD-10-CM | POA: Insufficient documentation

## 2022-11-08 DIAGNOSIS — R279 Unspecified lack of coordination: Secondary | ICD-10-CM | POA: Diagnosis not present

## 2022-11-08 DIAGNOSIS — R293 Abnormal posture: Secondary | ICD-10-CM | POA: Diagnosis not present

## 2022-11-08 DIAGNOSIS — R102 Pelvic and perineal pain: Secondary | ICD-10-CM | POA: Diagnosis not present

## 2022-11-08 DIAGNOSIS — M6281 Muscle weakness (generalized): Secondary | ICD-10-CM | POA: Diagnosis not present

## 2022-11-08 NOTE — Patient Instructions (Signed)
Vulvar/vaginal Massage: This is a technique to help decrease painful sensitivity in the vaginal area. It can also help to restore normal moisture levels in the vaginal tissues. With coconut oil, aloe, jojoba oil, or a specific vaginal moisturizer, gently massage into vaginal tissues. Think of this as part of your post-shower routine and moisturizing just like you would the rest of the body with lotion. This helps to increase good blood flow to the vaginal tissues. In addition, it also teaches the body that touch to the vagina does not have to be painful or threatening, but moisturizing and gentle.    Double-voiding:  This technique is to help with post-void dribbling, or leaking a little bit when you stand up right after urinating.  Use relaxed toileting mechanics to urinate as much as you feel like you have to without straining.  Sit back upright from leaning forward and relax this way for 10-20 seconds.  Lean forward again to finish voiding any amount more.     [facility] [address] [place], [place] [address] Phone # [phone] Fax [phone]

## 2022-11-08 NOTE — Therapy (Signed)
OUTPATIENT PHYSICAL THERAPY FEMALE PELVIC EVALUATION   Patient Name: Jenny Barnes MRN: [id number] DOB:06-Dec-1995, 27 y.o., female Today's Date: 11/08/2022  END OF SESSION:  PT End of Session - 11/08/22 0805     Visit Number 1    Date for PT Re-Evaluation 04/25/23    Authorization Type [employer]    PT Start Time 0804    PT Stop Time 0840    PT Time Calculation (min) 36 min    Activity Tolerance Patient tolerated treatment well    Behavior During Therapy [facility] for tasks assessed/performed             Past Medical History:  Diagnosis Date   Hypothyroidism    Kidney stone    August 2021   Thyroid disease    Past Surgical History:  Procedure Laterality Date   CYSTOSCOPY W/ URETERAL STENT REMOVAL     CYSTOSCOPY WITH RETROGRADE PYELOGRAM, URETEROSCOPY AND STENT PLACEMENT Right 04/09/2020   Procedure: URETEROSCOPY, BASKETING OF STONE & DOUBLE J. STENT PLACEMENT;  Surgeon: Alexis Frock, MD;  Location: [place];  Service: Urology;  Laterality: Right;   WISDOM TOOTH EXTRACTION     Patient Active Problem List   Diagnosis Date Noted   Post term pregnancy 10/02/2022   Encounter for elective induction of labor 06/14/2020   Ureteral stone with hydronephrosis 04/09/2020   Nephrolithiasis 04/05/2020   Indication for care in labor or delivery 09/19/2018   Kidney stone    Intractable abdominal pain 05/04/2016    [facility]P: [facility]    REFERRING PROVIDER: Jerelyn Charles, MD  REFERRING DIAG: N39.3 (ICD-10-CM) - Stress incontinence (female) (female)  THERAPY DIAG:  Other muscle spasm  Muscle weakness (generalized)  Unspecified lack of coordination  Abnormal posture  Other low back pain  Pelvic pain  Mixed stress and urge urinary incontinence  Rationale for Evaluation and Treatment: Rehabilitation  ONSET DATE: 4 years ago  SUBJECTIVE:                                                                                                                                                                                            11/08/22 SUBJECTIVE STATEMENT: Pt just had 3rd child and she feels like she does not have control over her bladder throughout the day. She is not lactating. Fluid intake: Yes: states that on a good day she drinks 4 large stanley cups    PAIN:  Are you having pain? Yes NPRS scale: 8/10 Pain location:  low back pain and bil hip pain  Pain type: aching and stiff Pain description:  low back pain and bil hip  pain    Aggravating factors: trying to roll in bed or standing on one leg; lying flat on back Relieving factors: avoiding painful activities (lying flat on back)  PRECAUTIONS: None  WEIGHT BEARING RESTRICTIONS: No  FALLS:  Has patient fallen in last 6 months? No  LIVING ENVIRONMENT: Lives with: lives with their family Lives in: House/apartment  OCCUPATION: stay at home mom  PLOF: Independent  PATIENT GOALS: decrease leaking and pain; strengthen core  PERTINENT HISTORY:  G3P3, hypothyroidism, hx kidney stones Sexual abuse: No  BOWEL MOVEMENT: Pain with bowel movement: No Type of bowel movement:Frequency 1x/day and Strain No Fully empty rectum: Yes: - Leakage: No Pads: No Fiber supplement: No  URINATION: Pain with urination: No - sometimes it feels weird Fully empty bladder: No - she is not sure that she can tell - she will pee small amounts and then 30 minutes later pee a lot Stream: Strong Urgency: Yes: sometimes very urgent, will leak if she is doing something Frequency: every 2-3 hours Leakage: Urge to void, Laughing, and Exercise Pads: Yes: if she is exercising  INTERCOURSE: Pain with intercourse: During Penetration Ability to have vaginal penetration:  Yes: - Climax: not since last delivery - been getting harder to have one with each pregnancy  Marinoff Scale: 1/3  PREGNANCY: Vaginal deliveries 3 Tearing Yes: with first C-section deliveries 0 Currently pregnant  No  PROLAPSE: None   OBJECTIVE:  11/08/22: PATIENT SURVEYS:   PFIQ-7 67  COGNITION: Overall cognitive status: Within functional limits for tasks assessed     SENSATION: Light touch: Appears intact Proprioception: Appears intact   GAIT: Comments: Increased anterior pelvic tilt  POSTURE: rounded shoulders, forward head, increased thoracic kyphosis, anterior pelvic tilt, and Rt thoracic curvature  LUMBARAROM/PROM:  A/PROM A/PROM  Eval (% available)  Flexion 100  Extension 100, discomfort  Right lateral flexion WNL  Left lateral flexion WNL  Right rotation 75  Left rotation 75   (Blank rows = not tested)  PALPATION:   General  tenderness in bil lower abdomen and midline abdomen from pelvis to sternum                External Perineal Exam WNL                             Internal Pelvic Floor tenderness throughout superficial and deep layers   Patient confirms identification and approves PT to assess internal pelvic floor and treatment Yes  PELVIC MMT:   MMT eval  Vaginal 1/5, minimal motor control (initially bearing down with increased abdominal pressure)  Internal Anal Sphincter   External Anal Sphincter   Puborectalis   Diastasis Recti 2 finger width separation at umbilicus, 3 above, and 1 below; minimal distortion with increased pressure  (Blank rows = not tested)        TONE: High throughout superficial and deep pelvic floor  PROLAPSE: Possible mild anterior vaginal wall laxity  TODAY'S TREATMENT:  DATE:  11/08/22  EVAL  Neuromuscular re-education: Pelvic floor contraction/relaxation training Therapeutic activities: Double voiding Vulvovaginal massage  Check all possible CPT codes: [phone]- Neuro Re-education and 97530 - Therapeutic Activities    Check all conditions that are expected to impact treatment: Current pregnancy or  recent postpartum   If treatment provided at initial evaluation, no treatment charged due to lack of authorization.       PATIENT EDUCATION:  Education details: see above Person educated: Patient Education method: Explanation, Demonstration, Tactile cues, Verbal cues, and Handouts Education comprehension: verbalized understanding  HOME EXERCISE PROGRAM: [id number]  ASSESSMENT:  CLINICAL IMPRESSION: Patient is a 27 y.o. female who was seen today for physical therapy evaluation and treatment for urinary incontinence and pelvic/low back pain. Exam findings notable for abnormal posture, abdominal weakness/pain, superficial and deep pelvic floor tightness/high tone, mild anterior vaginal wall laxity, poor pelvic floor motor control, decreased pelvic floor strength/endurance. Signs and symptoms are most consistent with pelvic floor weakness and decreased coordination, in addition to overall core weakness, that has increased tone surrounding pelvic for improved stability. Initial treatment consisted of pelvic floor contraction training, double voiding, and vulvovaginal massage; we will plan to progress down training and mobility next session. She will continue to benefit form skilled PT intervention in order to decrease urinary incontinence, decrease low back/pelvic pain, and address impairments.   OBJECTIVE IMPAIRMENTS: decreased activity tolerance, decreased coordination, decreased endurance, decreased strength, increased fascial restrictions, increased muscle spasms, impaired tone, postural dysfunction, and pain.   ACTIVITY LIMITATIONS: carrying, lifting, bending, standing, sleeping, and continence  PARTICIPATION LIMITATIONS: interpersonal relationship and community activity  PERSONAL FACTORS: 1 comorbidity: G3P3, hypothyroidism, hx kidney stones with stent placement  are also affecting patient's functional outcome.   REHAB POTENTIAL: Good  CLINICAL DECISION MAKING:  Stable/uncomplicated  EVALUATION COMPLEXITY: Low   GOALS: Goals reviewed with patient? Yes  SHORT TERM GOALS: Target date: 12/13/22  Pt will be independent with HEP.   Baseline: Goal status: INITIAL  2.  Pt will be independent with the knack, urge suppression technique, and double voiding in order to improve bladder habits and decrease urinary incontinence.   Baseline:  Goal status: INITIAL  3.  Pt will be independent with diaphragmatic breathing and down training activities in order to improve pelvic floor relaxation.  Baseline:  Goal status: INITIAL  4.  Pt will be able to correctly perform diaphragmatic breathing and appropriate pressure management in order to prevent worsening vaginal wall laxity and improve pelvic floor A/ROM.   Baseline:  Goal status: INITIAL    LONG TERM GOALS: Target date: 04/25/23  Pt will be independent with advanced HEP.   Baseline:  Goal status: INITIAL  2. Pt will demonstrate normal pelvic floor muscle tone and A/ROM, able to achieve 3/5 strength with contractions and 10 sec endurance, in order to provide appropriate lumbopelvic support in functional activities.     Baseline:  Goal status: INITIAL  3.  Pt will report no leaks with laughing, exercising, or throughout the day with urgency in order to improve comfort with interpersonal relationships and community activities.   Baseline:  Goal status: INITIAL    PLAN:  PT FREQUENCY: 1-2x/week  PT DURATION: 6 months  PLANNED INTERVENTIONS: Therapeutic exercises, Therapeutic activity, Neuromuscular re-education, Balance training, Gait training, Patient/Family education, Self Care, Joint mobilization, Dry Needling, Biofeedback, and Manual therapy  PLAN FOR NEXT SESSION: Down training; core training/progressions.    Heather Roberts, PT, DPT03/13/2410:43 AM

## 2022-11-13 ENCOUNTER — Ambulatory Visit: Payer: Medicaid Other

## 2022-11-13 DIAGNOSIS — M5459 Other low back pain: Secondary | ICD-10-CM | POA: Diagnosis not present

## 2022-11-13 DIAGNOSIS — R102 Pelvic and perineal pain: Secondary | ICD-10-CM

## 2022-11-13 DIAGNOSIS — R279 Unspecified lack of coordination: Secondary | ICD-10-CM

## 2022-11-13 DIAGNOSIS — N3946 Mixed incontinence: Secondary | ICD-10-CM | POA: Diagnosis not present

## 2022-11-13 DIAGNOSIS — M6281 Muscle weakness (generalized): Secondary | ICD-10-CM | POA: Diagnosis not present

## 2022-11-13 DIAGNOSIS — R293 Abnormal posture: Secondary | ICD-10-CM

## 2022-11-13 DIAGNOSIS — M62838 Other muscle spasm: Secondary | ICD-10-CM

## 2022-11-13 DIAGNOSIS — N393 Stress incontinence (female) (male): Secondary | ICD-10-CM | POA: Diagnosis not present

## 2022-11-13 NOTE — Therapy (Addendum)
OUTPATIENT PHYSICAL THERAPY TREATMENT NOTE   Patient Name: Jenny Barnes MRN: [id number] DOB:Jan 10, 1996, 28 y.o., female Today's Date: 11/13/2022  PCP: [facility] REFERRING PROVIDER: Marlow Baars, MD  END OF SESSION:   PT End of Session - 11/13/22 0933     Visit Number 2    Date for PT Re-Evaluation 04/25/23    Authorization Type [employer]athy Blue    Authorization Time Period 11/08/2022-01/06/2023    Authorization - Visit Number 1    Authorization - Number of Visits 7    PT Start Time 0933    PT Stop Time 1011    PT Time Calculation (min) 38 min    Activity Tolerance Patient tolerated treatment well    Behavior During Therapy [facility] for tasks assessed/performed             Past Medical History:  Diagnosis Date   Hypothyroidism    Kidney stone    August 2021   Thyroid disease    Past Surgical History:  Procedure Laterality Date   CYSTOSCOPY W/ URETERAL STENT REMOVAL     CYSTOSCOPY WITH RETROGRADE PYELOGRAM, URETEROSCOPY AND STENT PLACEMENT Right 04/09/2020   Procedure: URETEROSCOPY, BASKETING OF STONE & DOUBLE J. STENT PLACEMENT;  Surgeon: Sebastian Ache, MD;  Location: [facility];  Service: Urology;  Laterality: Right;   WISDOM TOOTH EXTRACTION     Patient Active Problem List   Diagnosis Date Noted   Post term pregnancy 10/02/2022   Encounter for elective induction of labor 06/14/2020   Ureteral stone with hydronephrosis 04/09/2020   Nephrolithiasis 04/05/2020   Indication for care in labor or delivery 09/19/2018   Kidney stone    Intractable abdominal pain 05/04/2016    REFERRING DIAG: N39.3 (ICD-10-CM) - Stress incontinence (female) (female)  THERAPY DIAG:  Other muscle spasm  Muscle weakness (generalized)  Unspecified lack of coordination  Abnormal posture  Other low back pain  Pelvic pain  Mixed stress and urge urinary incontinence  Rationale for Evaluation and Treatment Rehabilitation  PERTINENT HISTORY: G3P3,  hypothyroidism, hx kidney stones  PRECAUTIONS: NA  SUBJECTIVE:                                                                                                                                                                                      SUBJECTIVE STATEMENT:  Pt states that she did well after first visit. She feels like she has had increased sensitivity, though - she feels like she now has pain when she urinates.    PAIN:  Are you having pain? Yes NPRS scale: 8/10 Pain location:  low back pain and bil hip pain   Pain type:  aching and stiff Pain description:  low back pain and bil hip pain     Aggravating factors: trying to roll in bed or standing on one leg; lying flat on back Relieving factors: avoiding painful activities (lying flat on back)   11/08/22 SUBJECTIVE STATEMENT: Pt just had 3rd child and she feels like she does not have control over her bladder throughout the day. She is not lactating. Fluid intake: Yes: states that on a good day she drinks 4 large stanley cups     PAIN:  Are you having pain? Yes NPRS scale: 8/10 Pain location:  low back pain and bil hip pain   Pain type: aching and stiff Pain description:  low back pain and bil hip pain     Aggravating factors: trying to roll in bed or standing on one leg; lying flat on back Relieving factors: avoiding painful activities (lying flat on back)   PRECAUTIONS: None   WEIGHT BEARING RESTRICTIONS: No   FALLS:  Has patient fallen in last 6 months? No   LIVING ENVIRONMENT: Lives with: lives with their family Lives in: House/apartment   OCCUPATION: stay at home mom   PLOF: Independent   PATIENT GOALS: decrease leaking and pain; strengthen core   PERTINENT HISTORY:  G3P3, hypothyroidism, hx kidney stones Sexual abuse: No   BOWEL MOVEMENT: Pain with bowel movement: No Type of bowel movement:Frequency 1x/day and Strain No Fully empty rectum: Yes: - Leakage: No Pads: No Fiber supplement: No    URINATION: Pain with urination: No - sometimes it feels weird Fully empty bladder: No - she is not sure that she can tell - she will pee small amounts and then 30 minutes later pee a lot Stream: Strong Urgency: Yes: sometimes very urgent, will leak if she is doing something Frequency: every 2-3 hours Leakage: Urge to void, Laughing, and Exercise Pads: Yes: if she is exercising   INTERCOURSE: Pain with intercourse: During Penetration Ability to have vaginal penetration:  Yes: - Climax: not since last delivery - been getting harder to have one with each pregnancy  Marinoff Scale: 1/3   PREGNANCY: Vaginal deliveries 3 Tearing Yes: with first C-section deliveries 0 Currently pregnant No   PROLAPSE: None     OBJECTIVE:  11/08/22: PATIENT SURVEYS:    PFIQ-7 67   COGNITION: Overall cognitive status: Within functional limits for tasks assessed                          SENSATION: Light touch: Appears intact Proprioception: Appears intact     GAIT: Comments: Increased anterior pelvic tilt   POSTURE: rounded shoulders, forward head, increased thoracic kyphosis, anterior pelvic tilt, and Rt thoracic curvature   LUMBARAROM/PROM:   A/PROM A/PROM  Eval (% available)  Flexion 100  Extension 100, discomfort  Right lateral flexion WNL  Left lateral flexion WNL  Right rotation 75  Left rotation 75   (Blank rows = not tested)   PALPATION:   General  tenderness in bil lower abdomen and midline abdomen from pelvis to sternum                 External Perineal Exam WNL                             Internal Pelvic Floor tenderness throughout superficial and deep layers    Patient confirms identification and approves PT to assess internal pelvic floor and  treatment Yes   PELVIC MMT:   MMT eval  Vaginal 1/5, minimal motor control (initially bearing down with increased abdominal pressure)  Internal Anal Sphincter    External Anal Sphincter    Puborectalis    Diastasis  Recti 2 finger width separation at umbilicus, 3 above, and 1 below; minimal distortion with increased pressure  (Blank rows = not tested)         TONE: High throughout superficial and deep pelvic floor   PROLAPSE: Possible mild anterior vaginal wall laxity   TODAY'S TREATMENT:                                                                                                                              DATE:  11/13/22 Neuromuscular re-education: Diaphragmatic breathing training with towel roll Cat cow 2 x 10 Child's pose 10 breaths  Happy baby 10 breaths UE ball press 10x Exercises: Piriformis stretch 60 sec bil Single knee to chest 5x bil Bent knee fall out 10x bil Open books 10x bil  11/08/22  EVAL  Neuromuscular re-education: Pelvic floor contraction/relaxation training Therapeutic activities: Double voiding Vulvovaginal massage   Check all possible CPT codes: 02542- Neuro Re-education and 97530 - Therapeutic Activities                        Check all conditions that are expected to impact treatment: Current pregnancy or recent postpartum        If treatment provided at initial evaluation, no treatment charged due to lack of authorization.                                PATIENT EDUCATION:  Education details: see above Person educated: Patient Education method: Explanation, Demonstration, Tactile cues, Verbal cues, and Handouts Education comprehension: verbalized understanding   HOME EXERCISE PROGRAM: [id number]   ASSESSMENT:   CLINICAL IMPRESSION: Pt had discomfort with urinating over this last week, possibly due to increased pelvic floor tension with contractions. We discussed diaphragmatic breathing and making sure she is relaxing pelvic floor. She had difficulty coordinating good breathing mechanics, but was able to improve. She increased proprioception of pelvic floor relaxation/contraction throughout exercises as well. Good tolerance to beginning core  facilitation training and gentle progressions. She will continue to benefit form skilled PT intervention in order to decrease urinary incontinence, decrease low back/pelvic pain, and address impairments.    OBJECTIVE IMPAIRMENTS: decreased activity tolerance, decreased coordination, decreased endurance, decreased strength, increased fascial restrictions, increased muscle spasms, impaired tone, postural dysfunction, and pain.    ACTIVITY LIMITATIONS: carrying, lifting, bending, standing, sleeping, and continence   PARTICIPATION LIMITATIONS: interpersonal relationship and community activity   PERSONAL FACTORS: 1 comorbidity: G3P3, hypothyroidism, hx kidney stones with stent placement  are also affecting patient's functional outcome.    REHAB POTENTIAL: Good   CLINICAL DECISION MAKING: Stable/uncomplicated   EVALUATION COMPLEXITY: Low  GOALS: Goals reviewed with patient? Yes   SHORT TERM GOALS: Target date: 12/13/22   Pt will be independent with HEP.    Baseline: Goal status: INITIAL   2.  Pt will be independent with the knack, urge suppression technique, and double voiding in order to improve bladder habits and decrease urinary incontinence.    Baseline:  Goal status: INITIAL   3.  Pt will be independent with diaphragmatic breathing and down training activities in order to improve pelvic floor relaxation.   Baseline:  Goal status: INITIAL   4.  Pt will be able to correctly perform diaphragmatic breathing and appropriate pressure management in order to prevent worsening vaginal wall laxity and improve pelvic floor A/ROM.    Baseline:  Goal status: INITIAL       LONG TERM GOALS: Target date: 04/25/23   Pt will be independent with advanced HEP.    Baseline:  Goal status: INITIAL   2. Pt will demonstrate normal pelvic floor muscle tone and A/ROM, able to achieve 3/5 strength with contractions and 10 sec endurance, in order to provide appropriate lumbopelvic support in  functional activities.      Baseline:  Goal status: INITIAL   3.  Pt will report no leaks with laughing, exercising, or throughout the day with urgency in order to improve comfort with interpersonal relationships and community activities.    Baseline:  Goal status: INITIAL       PLAN:   PT FREQUENCY: 1-2x/week   PT DURATION: 6 months   PLANNED INTERVENTIONS: Therapeutic exercises, Therapeutic activity, Neuromuscular re-education, Balance training, Gait training, Patient/Family education, Self Care, Joint mobilization, Dry Needling, Biofeedback, and Manual therapy   PLAN FOR NEXT SESSION: core training/progressions   Julio Alm, PT, DPT03/18/2410:24 AM  PHYSICAL THERAPY DISCHARGE SUMMARY  Visits from Start of Care: 2  Current functional level related to goals / functional outcomes: Unknown   Remaining deficits: See above   Education / Equipment: HEP   Patient agrees to discharge. Patient goals were partially met. Patient is being discharged due to not returning since the last visit.  Julio Alm, PT, DPT02/03/254:27 PM

## 2022-11-21 ENCOUNTER — Ambulatory Visit: Payer: Medicaid Other

## 2022-12-05 ENCOUNTER — Ambulatory Visit: Payer: Medicaid Other

## 2023-11-25 ENCOUNTER — Emergency Department (HOSPITAL_BASED_OUTPATIENT_CLINIC_OR_DEPARTMENT_OTHER)
Admission: EM | Admit: 2023-11-25 | Discharge: 2023-11-26 | Disposition: A | Discharge status: Home / Self Care | Attending: Emergency Medicine | Admitting: Emergency Medicine

## 2023-11-25 ENCOUNTER — Emergency Department (HOSPITAL_BASED_OUTPATIENT_CLINIC_OR_DEPARTMENT_OTHER)

## 2023-11-25 ENCOUNTER — Encounter (HOSPITAL_BASED_OUTPATIENT_CLINIC_OR_DEPARTMENT_OTHER): Payer: Self-pay

## 2023-11-25 ENCOUNTER — Other Ambulatory Visit: Payer: Self-pay

## 2023-11-25 DIAGNOSIS — N132 Hydronephrosis with renal and ureteral calculous obstruction: Secondary | ICD-10-CM | POA: Diagnosis not present

## 2023-11-25 DIAGNOSIS — R109 Unspecified abdominal pain: Secondary | ICD-10-CM | POA: Diagnosis present

## 2023-11-25 DIAGNOSIS — N200 Calculus of kidney: Secondary | ICD-10-CM

## 2023-11-25 DIAGNOSIS — N3000 Acute cystitis without hematuria: Secondary | ICD-10-CM | POA: Insufficient documentation

## 2023-11-25 LAB — CBC WITH DIFFERENTIAL/PLATELET
Abs Immature Granulocytes: 0.01 10*3/uL (ref 0.00–0.07)
Basophils Absolute: 0 10*3/uL (ref 0.0–0.1)
Basophils Relative: 1 %
Eosinophils Absolute: 0.1 10*3/uL (ref 0.0–0.5)
Eosinophils Relative: 3 %
HCT: 27.7 % — ABNORMAL LOW (ref 36.0–46.0)
Hemoglobin: 9.1 g/dL — ABNORMAL LOW (ref 12.0–15.0)
Immature Granulocytes: 0 %
Lymphocytes Relative: 29 %
Lymphs Abs: 1.3 10*3/uL (ref 0.7–4.0)
MCH: 29.9 pg (ref 26.0–34.0)
MCHC: 32.9 g/dL (ref 30.0–36.0)
MCV: 91.1 fL (ref 80.0–100.0)
Monocytes Absolute: 0.6 10*3/uL (ref 0.1–1.0)
Monocytes Relative: 12 %
Neutro Abs: 2.5 10*3/uL (ref 1.7–7.7)
Neutrophils Relative %: 55 %
Platelets: 187 10*3/uL (ref 150–400)
RBC: 3.04 MIL/uL — ABNORMAL LOW (ref 3.87–5.11)
RDW: 14.8 % (ref 11.5–15.5)
WBC: 4.6 10*3/uL (ref 4.0–10.5)
nRBC: 0 % (ref 0.0–0.2)

## 2023-11-25 LAB — URINALYSIS, ROUTINE W REFLEX MICROSCOPIC
Bilirubin Urine: NEGATIVE
Glucose, UA: NEGATIVE mg/dL
Hgb urine dipstick: NEGATIVE
Ketones, ur: NEGATIVE mg/dL
Nitrite: NEGATIVE
Protein, ur: 30 mg/dL — AB
Specific Gravity, Urine: 1.021 (ref 1.005–1.030)
WBC, UA: >50 WBC/hpf (ref 0–5)
pH: 6.5 (ref 5.0–8.0)

## 2023-11-25 LAB — BASIC METABOLIC PANEL WITH GFR
Anion gap: 8 (ref 5–15)
BUN: 20 mg/dL (ref 6–20)
CO2: 26 mmol/L (ref 22–32)
Calcium: 9 mg/dL (ref 8.9–10.3)
Chloride: 104 mmol/L (ref 98–111)
Creatinine, Ser: 1.55 mg/dL — ABNORMAL HIGH (ref 0.44–1.00)
GFR, Estimated: 47 mL/min — ABNORMAL LOW (ref >60)
Glucose, Bld: 121 mg/dL — ABNORMAL HIGH (ref 70–99)
Potassium: 3.7 mmol/L (ref 3.5–5.1)
Sodium: 138 mmol/L (ref 135–145)

## 2023-11-25 LAB — PREGNANCY, URINE: Preg Test, Ur: NEGATIVE

## 2023-11-25 MED ORDER — SULFAMETHOXAZOLE-TRIMETHOPRIM 800-160 MG PO TABS: 1.0000 | ORAL_TABLET | Freq: Two times a day (BID) | ORAL | 0 refills | Status: AC

## 2023-11-25 MED ORDER — SODIUM CHLORIDE 0.9 % IV SOLN
1.0000 g | Freq: Once | INTRAVENOUS | Status: AC
  Administered 2023-11-25: 1 g via INTRAVENOUS
  Filled 2023-11-25: qty 10

## 2023-11-25 NOTE — ED Provider Notes (Signed)
 [place] EMERGENCY DEPARTMENT AT [facility] Provider Note   CSN: [id number] Arrival date & time: 11/25/23  1905     History  Chief Complaint  Patient presents with   Flank Pain    L    Jenny Barnes is a 28 y.o. female.  Patient is a 28 year old female who presents with flank pain which she attributes to kidney stones.  She said she started having pain in her left flank area about a month ago.  However on chart review, I see that she was seen in early February by her PCP.  She had a CT scan on February 6 which showed a cluster of stones in her proximal left ureter.  She has been [facility] to get into [facility] to be seen to develop a plan to manage the kidney stones but has not been able to get appointment for 2-3 more weeks per her report.  She says over the last week she has had some worsening pain.  She says it waxes and wanes in intensity.  No nausea or vomiting.  No fevers.  No dysuria or burning on urination.       Home Medications Prior to Admission medications   Medication Sig Start Date End Date Taking? Authorizing Provider  sulfamethoxazole-trimethoprim (BACTRIM DS) 800-160 MG tablet Take 1 tablet by mouth 2 (two) times daily for 7 days. 11/25/23 12/02/23 Yes Rolan Bucco, MD  acetaminophen (TYLENOL) 500 MG tablet Take 2 tablets (1,000 mg total) by mouth every 8 (eight) hours. 04/14/20   Philip Aspen, DO  famotidine (PEPCID) 20 MG tablet Take 20 mg by mouth 2 (two) times daily.    [provider]  ibuprofen (ADVIL) 600 MG tablet Take 1 tablet (600 mg total) by mouth every 6 (six) hours as needed for moderate pain or cramping. 10/03/22   Edwinna Areola, DO  levothyroxine (SYNTHROID) 200 MCG tablet Take 225 mcg by mouth daily before breakfast.     [provider]  omeprazole (PRILOSEC) 10 MG capsule Take 10 mg by mouth daily.    [provider]  ondansetron (ZOFRAN) 4 MG tablet Take 4 mg by mouth every 8 (eight)  hours as needed for nausea or vomiting.    [provider]  Prenatal Vit-Fe Fumarate-FA (PRENATAL MULTIVITAMIN) TABS tablet Take 1 tablet by mouth daily at 12 noon. Patient not taking: Reported on 11/08/2022    [provider]  promethazine (PHENERGAN) 25 MG tablet Take 1 tablet (25 mg total) by mouth every 6 (six) hours as needed for nausea or vomiting. 04/14/20   Philip Aspen, DO      Allergies    Patient has no known allergies.    Review of Systems   Review of Systems  Constitutional:  Negative for chills, diaphoresis, fatigue and fever.  HENT:  Negative for congestion, rhinorrhea and sneezing.   Eyes: Negative.   Respiratory:  Negative for cough, chest tightness and shortness of breath.   Cardiovascular:  Negative for chest pain and leg swelling.  Gastrointestinal:  Positive for abdominal pain. Negative for blood in stool, diarrhea, nausea and vomiting.  Genitourinary:  Positive for flank pain. Negative for difficulty urinating, frequency and hematuria.  Musculoskeletal:  Negative for arthralgias and back pain.  Skin:  Negative for rash.  Neurological:  Negative for dizziness, speech difficulty, weakness, numbness and headaches.    Physical Exam Updated Vital Signs BP 106/80   Pulse 70   Temp 98.3 F (36.8 C) (Oral)   Resp  18   SpO2 96%  Physical Exam Constitutional:      Appearance: She is well-developed.  HENT:     Head: Normocephalic and atraumatic.  Eyes:     Pupils: Pupils are equal, round, and reactive to light.  Cardiovascular:     Rate and Rhythm: Normal rate and regular rhythm.     Heart sounds: Normal heart sounds.  Pulmonary:     Effort: Pulmonary effort is normal. No respiratory distress.     Breath sounds: Normal breath sounds. No wheezing or rales.  Chest:     Chest wall: No tenderness.  Abdominal:     General: Bowel sounds are normal.     Palpations: Abdomen is soft.     Tenderness: There is no abdominal tenderness. There is no  guarding or rebound.  Musculoskeletal:        General: Normal range of motion.     Cervical back: Normal range of motion and neck supple.  Lymphadenopathy:     Cervical: No cervical adenopathy.  Skin:    General: Skin is warm and dry.     Findings: No rash.  Neurological:     Mental Status: She is alert and oriented to person, place, and time.     ED Results / Procedures / Treatments   Labs (all labs ordered are listed, but only abnormal results are displayed) Labs Reviewed  URINALYSIS, ROUTINE W REFLEX MICROSCOPIC - Abnormal; Notable for the following components:      Result Value   APPearance HAZY (*)    Protein, ur 30 (*)    Leukocytes,Ua LARGE (*)    Bacteria, UA RARE (*)    All other components within normal limits  BASIC METABOLIC PANEL WITH GFR - Abnormal; Notable for the following components:   Glucose, Bld 121 (*)    Creatinine, Ser 1.55 (*)    GFR, Estimated 47 (*)    All other components within normal limits  CBC WITH DIFFERENTIAL/PLATELET - Abnormal; Notable for the following components:   RBC 3.04 (*)    Hemoglobin 9.1 (*)    HCT 27.7 (*)    All other components within normal limits  PREGNANCY, URINE    EKG None  Radiology CT Renal Stone Study Result Date: 11/25/2023 CLINICAL DATA:  Flank pain EXAM: CT ABDOMEN AND PELVIS WITHOUT CONTRAST TECHNIQUE: Multidetector CT imaging of the abdomen and pelvis was performed following the standard protocol without IV contrast. RADIATION DOSE REDUCTION: This exam was performed according to the departmental dose-optimization program which includes automated exposure control, adjustment of the mA and/or kV according to patient size and/or use of iterative reconstruction technique. COMPARISON:  CT 04/05/2020 FINDINGS: Lower chest: Lung bases are clear Hepatobiliary: No focal liver abnormality is seen. No gallstones, gallbladder wall thickening, or biliary dilatation. Pancreas: Unremarkable. No pancreatic ductal dilatation or  surrounding inflammatory changes. Spleen: Normal in size without focal abnormality. Adrenals/Urinary Tract: Adrenal glands are normal. Multiple punctate nonobstructing right kidney stones. Mild left hydronephrosis and hydroureter, secondary to at least 2 adjacent stones in the distal left ureter, the more cranial stone measures about 7 mm and the more caudal stone measures about 6 mm, stones are visualized at the mid sacral level. The bladder is unremarkable Stomach/Bowel: Stomach is within normal limits. Appendix appears normal. No evidence of bowel wall thickening, distention, or inflammatory changes. Vascular/Lymphatic: No significant vascular findings are present. No enlarged abdominal or pelvic lymph nodes. Reproductive: Uterus and bilateral adnexa are unremarkable. Other: Negative for pelvic effusion or  free air. Small fat containing periumbilical hernia Musculoskeletal: No acute or significant osseous findings. IMPRESSION: 1. Mild left hydronephrosis and hydroureter, secondary to at least 2 adjacent stones in the distal left ureter, the more cranial stone measures about 7 mm and the more caudal stone measures about 6 mm. 2. Multiple punctate nonobstructing right kidney stones. Electronically Signed   By: Jasmine Pang M.D.   On: 11/25/2023 20:20    Procedures Procedures    Medications Ordered in ED Medications  cefTRIAXone (ROCEPHIN) 1 g in sodium chloride 0.9 % 100 mL IVPB (0 g Intravenous Stopped 11/25/23 2252)    ED Course/ Medical Decision Making/ A&P                                 Medical Decision Making Amount and/or Complexity of Data Reviewed Labs: ordered. Radiology: ordered.  Risk Prescription drug management.   Patient is a 28 year old female who presents with left flank pain.  Currently she is not having any significant discomfort.  She does not have any tenderness on exam.  Labs show mild elevation in her creatinine as compared to her prior values.  Her hemoglobin is low  but similar to prior values based on chart review.  Her urine does show some suggestions of infection.  She was given a dose of IV Rocephin.  CT scan shows 2 stones in the distal left ureter.  I spoke with Dr. Ronne Binning with urology.  Given that she does not have a fever or other significant symptoms, he does not feel that she needs to be emergently admitted for stent placement.  He says that they can see her in the [place] office tomorrow by the nurse practitioner.  She is amenable to this plan.  She was given contact information and advised to call first thing in the morning for an appointment tomorrow.  She was given a prescription for Bactrim.  She declines the need for any pain medication.  She was given strict return precautions.  Final Clinical Impression(s) / ED Diagnoses Final diagnoses:  Kidney stone  Acute cystitis without hematuria    Rx / DC Orders ED Discharge Orders          Ordered    sulfamethoxazole-trimethoprim (BACTRIM DS) 800-160 MG tablet  2 times daily        11/25/23 2332              Rolan Bucco, MD 11/25/23 2336

## 2023-11-25 NOTE — Discharge Instructions (Addendum)
 Call in the morning to be seen at [facility] urology in the [place] office tomorrow.  Return to emergency room if you have any worsening symptoms such as fevers, worsening pain, vomiting or other worsening symptoms.

## 2023-11-25 NOTE — ED Triage Notes (Signed)
 Pt c/o kidney stones, hx of same. Symptoms "lots of pain in L back & flank"  Tylenol made pain "a little bit better"  States she's been battling "recurrent kidney stones," just seen urologist this week but pain has been tough to manage.

## 2023-11-27 ENCOUNTER — Other Ambulatory Visit: Payer: Self-pay | Admitting: Urology

## 2023-11-28 ENCOUNTER — Ambulatory Visit: Admitting: Urology

## 2023-12-03 ENCOUNTER — Encounter (HOSPITAL_COMMUNITY): Payer: Self-pay | Admitting: Urology

## 2023-12-03 NOTE — Progress Notes (Signed)
 Spoke w/ via phone for pre-op interview--- [facility] Lab needs dos---- UPT per anesthesia        Lab results------ COVID test -----patient states asymptomatic no test needed Arrive at -------0730 NPO after MN NO Solid Food.   Pre-Surgery Ensure or G2:  Med rec completed Medications to take morning of surgery -----Levothyroxine, Flomax and sulfa Diabetic medication -----  GLP1 agonist last dose: GLP1 instructions:  Patient instructed no nail polish to be worn day of surgery Patient instructed to bring photo id and insurance card day of surgery Patient aware to have Driver (ride ) / caregiver    for 24 hours after surgery - Husband Alinda Money Patient Special Instructions ----- Shower with antibacterial soap. Pre-Op special Instructions -----  Patient verbalized understanding of instructions that were given at this phone interview. Patient denies chest pain, sob, fever, cough at the interview.

## 2023-12-07 ENCOUNTER — Ambulatory Visit (HOSPITAL_COMMUNITY): Payer: Self-pay | Admitting: Anesthesiology

## 2023-12-07 ENCOUNTER — Ambulatory Visit (HOSPITAL_COMMUNITY)
Admission: RE | Admit: 2023-12-07 | Discharge: 2023-12-07 | Disposition: A | Discharge status: Home / Self Care | Attending: Urology | Admitting: Urology

## 2023-12-07 ENCOUNTER — Encounter (HOSPITAL_COMMUNITY)
Admission: RE | Disposition: A | Discharge status: Home / Self Care | Payer: Self-pay | Source: Home / Self Care | Attending: Urology

## 2023-12-07 ENCOUNTER — Other Ambulatory Visit: Payer: Self-pay

## 2023-12-07 ENCOUNTER — Ambulatory Visit (HOSPITAL_COMMUNITY)

## 2023-12-07 ENCOUNTER — Ambulatory Visit (HOSPITAL_BASED_OUTPATIENT_CLINIC_OR_DEPARTMENT_OTHER): Payer: Self-pay | Admitting: Anesthesiology

## 2023-12-07 ENCOUNTER — Encounter (HOSPITAL_COMMUNITY): Payer: Self-pay | Admitting: Urology

## 2023-12-07 DIAGNOSIS — N132 Hydronephrosis with renal and ureteral calculous obstruction: Secondary | ICD-10-CM | POA: Insufficient documentation

## 2023-12-07 DIAGNOSIS — Z01818 Encounter for other preprocedural examination: Secondary | ICD-10-CM

## 2023-12-07 DIAGNOSIS — N201 Calculus of ureter: Secondary | ICD-10-CM | POA: Diagnosis not present

## 2023-12-07 HISTORY — PX: CYSTOSCOPY/URETEROSCOPY/HOLMIUM LASER/STENT PLACEMENT: SHX6546

## 2023-12-07 HISTORY — DX: Personal history of urinary calculi: Z87.442

## 2023-12-07 LAB — POCT PREGNANCY, URINE: Preg Test, Ur: NEGATIVE

## 2023-12-07 SURGERY — CYSTOSCOPY/URETEROSCOPY/HOLMIUM LASER/STENT PLACEMENT
Anesthesia: General | Site: Ureter | Laterality: Left

## 2023-12-07 MED ORDER — KETOROLAC TROMETHAMINE 10 MG PO TABS: 10.0000 mg | ORAL_TABLET | Freq: Four times a day (QID) | ORAL | 0 refills | Status: AC | PRN

## 2023-12-07 MED ORDER — CHLORHEXIDINE GLUCONATE 0.12 % MT SOLN
OROMUCOSAL | Status: AC
  Filled 2023-12-07: qty 15

## 2023-12-07 MED ORDER — FENTANYL CITRATE (PF) 100 MCG/2ML IJ SOLN: 25.0000 ug | INTRAMUSCULAR | Status: DC | PRN

## 2023-12-07 MED ORDER — DEXAMETHASONE SODIUM PHOSPHATE 10 MG/ML IJ SOLN
INTRAMUSCULAR | Status: AC
  Filled 2023-12-07: qty 1

## 2023-12-07 MED ORDER — OXYCODONE HCL 5 MG/5ML PO SOLN: 5.0000 mg | Freq: Once | ORAL | Status: DC | PRN

## 2023-12-07 MED ORDER — ONDANSETRON HCL 4 MG/2ML IJ SOLN
INTRAMUSCULAR | Status: DC | PRN
  Administered 2023-12-07: 4 mg via INTRAVENOUS

## 2023-12-07 MED ORDER — DEXAMETHASONE SODIUM PHOSPHATE 10 MG/ML IJ SOLN
INTRAMUSCULAR | Status: DC | PRN
  Administered 2023-12-07: 10 mg via INTRAVENOUS

## 2023-12-07 MED ORDER — ONDANSETRON HCL 4 MG/2ML IJ SOLN
INTRAMUSCULAR | Status: AC
Start: 2023-12-07 — End: ?
  Filled 2023-12-07: qty 2

## 2023-12-07 MED ORDER — CEFAZOLIN SODIUM-DEXTROSE 2-4 GM/100ML-% IV SOLN
2.0000 g | INTRAVENOUS | Status: AC
  Administered 2023-12-07: 2 g via INTRAVENOUS

## 2023-12-07 MED ORDER — TAMSULOSIN HCL 0.4 MG PO CAPS: 0.4000 mg | ORAL_CAPSULE | Freq: Every day | ORAL | 1 refills | Status: AC

## 2023-12-07 MED ORDER — FENTANYL CITRATE (PF) 250 MCG/5ML IJ SOLN
INTRAMUSCULAR | Status: DC | PRN
  Administered 2023-12-07: 50 ug via INTRAVENOUS

## 2023-12-07 MED ORDER — PROPOFOL 10 MG/ML IV BOLUS
INTRAVENOUS | Status: DC | PRN
  Administered 2023-12-07: 50 mg via INTRAVENOUS
  Administered 2023-12-07: 150 mg via INTRAVENOUS

## 2023-12-07 MED ORDER — OXYCODONE HCL 5 MG PO TABS: 5.0000 mg | ORAL_TABLET | Freq: Once | ORAL | Status: DC | PRN

## 2023-12-07 MED ORDER — PHENYLEPHRINE 80 MCG/ML (10ML) SYRINGE FOR IV PUSH (FOR BLOOD PRESSURE SUPPORT)
PREFILLED_SYRINGE | INTRAVENOUS | Status: DC | PRN
Start: 2023-12-07 — End: 2023-12-07
  Administered 2023-12-07: 80 ug via INTRAVENOUS

## 2023-12-07 MED ORDER — CEFAZOLIN SODIUM-DEXTROSE 2-4 GM/100ML-% IV SOLN
INTRAVENOUS | Status: AC
  Filled 2023-12-07: qty 100

## 2023-12-07 MED ORDER — PROPOFOL 10 MG/ML IV BOLUS
INTRAVENOUS | Status: AC
  Filled 2023-12-07: qty 20

## 2023-12-07 MED ORDER — LIDOCAINE 2% (20 MG/ML) 5 ML SYRINGE
INTRAMUSCULAR | Status: DC | PRN
  Administered 2023-12-07: 60 mg via INTRAVENOUS

## 2023-12-07 MED ORDER — ACETAMINOPHEN 10 MG/ML IV SOLN: 1000.0000 mg | Freq: Once | INTRAVENOUS | Status: DC | PRN

## 2023-12-07 MED ORDER — DROPERIDOL 2.5 MG/ML IJ SOLN: 0.6250 mg | Freq: Once | INTRAMUSCULAR | Status: DC | PRN

## 2023-12-07 MED ORDER — FENTANYL CITRATE (PF) 250 MCG/5ML IJ SOLN
INTRAMUSCULAR | Status: AC
  Filled 2023-12-07: qty 5

## 2023-12-07 MED ORDER — LACTATED RINGERS IV SOLN: INTRAVENOUS | Status: DC

## 2023-12-07 MED ORDER — SODIUM CHLORIDE 0.9 % IR SOLN
Status: DC | PRN
  Administered 2023-12-07: 3000 mL

## 2023-12-07 MED ORDER — KETOROLAC TROMETHAMINE 30 MG/ML IJ SOLN
INTRAMUSCULAR | Status: DC | PRN
  Administered 2023-12-07: 30 mg via INTRAVENOUS

## 2023-12-07 MED ORDER — MIDAZOLAM HCL 2 MG/2ML IJ SOLN
INTRAMUSCULAR | Status: DC | PRN
Start: 2023-12-07 — End: 2023-12-07
  Administered 2023-12-07: 2 mg via INTRAVENOUS

## 2023-12-07 MED ORDER — HYDROCODONE-ACETAMINOPHEN 5-325 MG PO TABS: 1.0000 | ORAL_TABLET | Freq: Four times a day (QID) | ORAL | 0 refills | Status: AC | PRN

## 2023-12-07 MED ORDER — ORAL CARE MOUTH RINSE: 15.0000 mL | Freq: Once | OROMUCOSAL | Status: AC

## 2023-12-07 MED ORDER — MIDAZOLAM HCL 2 MG/2ML IJ SOLN
INTRAMUSCULAR | Status: AC
  Filled 2023-12-07: qty 2

## 2023-12-07 MED ORDER — LIDOCAINE 2% (20 MG/ML) 5 ML SYRINGE
INTRAMUSCULAR | Status: AC
  Filled 2023-12-07: qty 5

## 2023-12-07 MED ORDER — CHLORHEXIDINE GLUCONATE 0.12 % MT SOLN
15.0000 mL | Freq: Once | OROMUCOSAL | Status: AC
  Administered 2023-12-07: 15 mL via OROMUCOSAL

## 2023-12-07 MED ORDER — IOHEXOL 300 MG/ML  SOLN
INTRAMUSCULAR | Status: DC | PRN
  Administered 2023-12-07: 10 mL via URETHRAL

## 2023-12-07 SURGICAL SUPPLY — 17 items
BAG DRAIN URO-CYSTO SKYTR STRL (DRAIN) ×1 IMPLANT
BASKET ZERO TIP 1.9FR (BASKET) IMPLANT
CATH URETL OPEN END 6FR 70 (CATHETERS) IMPLANT
CLOTH BEACON ORANGE TIMEOUT ST (SAFETY) ×1 IMPLANT
GLOVE BIO SURGEON STRL SZ7.5 (GLOVE) ×1 IMPLANT
GOWN STRL REUS W/ TWL XL LVL3 (GOWN DISPOSABLE) ×1 IMPLANT
GUIDEWIRE STR DUAL SENSOR (WIRE) ×1 IMPLANT
IV NS IRRIG 3000ML ARTHROMATIC (IV SOLUTION) ×1 IMPLANT
KIT TURNOVER KIT B (KITS) ×1 IMPLANT
LASER FIB FLEXIVA PULSE ID 365 (Laser) IMPLANT
MANIFOLD NEPTUNE II (INSTRUMENTS) ×1 IMPLANT
NS IRRIG 500ML POUR BTL (IV SOLUTION) ×1 IMPLANT
PACK CYSTO (CUSTOM PROCEDURE TRAY) ×1 IMPLANT
SLEEVE SCD COMPRESS KNEE MED (STOCKING) ×1 IMPLANT
TRACTIP FLEXIVA PULS ID 200XHI (Laser) IMPLANT
TUBE CONNECTING 12X1/4 (SUCTIONS) IMPLANT
TUBING UROLOGY SET (TUBING) ×1 IMPLANT

## 2023-12-07 NOTE — Anesthesia Procedure Notes (Signed)
 Procedure Name: LMA Insertion Date/Time: 12/07/2023 9:54 AM  Performed by: Joseline Mccampbell C, CRNAPre-anesthesia Checklist: Patient identified, Emergency Drugs available, Suction available and Patient being monitored Patient Re-evaluated:Patient Re-evaluated prior to induction Oxygen Delivery Method: Circle System Utilized Preoxygenation: Pre-oxygenation with 100% oxygen Induction Type: IV induction Ventilation: Mask ventilation without difficulty LMA: LMA inserted LMA Size: 4.0 Number of attempts: 1 Airway Equipment and Method: Bite block Placement Confirmation: positive ETCO2 Tube secured with: Tape Dental Injury: Teeth and Oropharynx as per pre-operative assessment

## 2023-12-07 NOTE — Discharge Instructions (Addendum)
 [facility] [phone] Post Ureteroscopy With or Without Stent Instructions  Definitions:  Ureter: The duct that transports urine from the kidney to the bladder. Stent:   A plastic hollow tube that is placed into the ureter, from the kidney to the                 bladder to prevent the ureter from swelling shut.  GENERAL INSTRUCTIONS:  Despite the fact that no skin incisions were used, the area around the ureter and bladder is raw and irritated. The stent is a foreign body which will further irritate the bladder wall. This irritation is manifested by increased frequency of urination, both day and night, and by an increase in the urge to urinate. In some, the urge to urinate is present almost always. Sometimes the urge is strong enough that you may not be able to stop yourself from urinating. The only real cure is to remove the stent and then give time for the bladder wall to heal which can't be done until the danger of the ureter swelling shut has passed, which varies.  You may see some blood in your urine while the stent is in place and a few days afterwards. Do not be alarmed, even if the urine was clear for a while. Get off your feet and drink lots of fluids until clearing occurs. If you start to pass clots or don't improve, call us.  DIET: You may return to your normal diet immediately. Because of the raw surface of your bladder, alcohol, spicy foods, acid type foods and drinks with caffeine may cause irritation or frequency and should be used in moderation. To keep your urine flowing freely and to avoid constipation, drink plenty of fluids during the day ( 8-10 glasses ). Tip: Avoid cranberry juice because it is very acidic.  ACTIVITY: Your physical activity doesn't need to be restricted. However, if you are very active, you may see some blood in your urine. We suggest that you reduce your activity under these circumstances until the bleeding has stopped.  BOWELS: It is  important to keep your bowels regular during the postoperative period. Straining with bowel movements can cause bleeding. A bowel movement every other day is reasonable. Use a mild laxative if needed, such as Milk of Magnesia 2-3 tablespoons, or 2 Dulcolax tablets. Call if you continue to have problems. If you have been taking narcotics for pain, before, during or after your surgery, you may be constipated. Take a laxative if necessary.   MEDICATION: You should resume your pre-surgery medications unless told not to. You may take oxybutynin or flomax if prescribed for bladder spasms or discomfort from the stent Take pain medication as directed for pain refractory to conservative management  PROBLEMS YOU SHOULD REPORT TO [place]: Fevers over 100.5 Fahrenheit. Heavy bleeding, or clots ( See above notes about blood in urine ). Inability to urinate. Drug reactions ( hives, rash, nausea, vomiting, diarrhea ). Severe burning or pain with urination that is not improving.   Post Anesthesia Home Care Instructions  Activity: Get plenty of rest for the remainder of the day. A responsible individual must stay with you for 24 hours following the procedure.  For the next 24 hours, DO NOT: -Drive a car -Advertising copywriter -Drink alcoholic beverages -Take any medication unless instructed by your physician -Make any legal decisions or sign important papers.  Meals: Start with liquid foods such as gelatin or soup. Progress to regular foods as tolerated. Avoid greasy, spicy,  heavy foods. If nausea and/or vomiting occur, drink only clear liquids until the nausea and/or vomiting subsides. Call your physician if vomiting continues.  Special Instructions/Symptoms: Your throat may feel dry or sore from the anesthesia or the breathing tube placed in your throat during surgery. If this causes discomfort, gargle with warm salt water. The discomfort should disappear within 24 hours.  If you had a scopolamine patch  placed behind your ear for the management of post- operative nausea and/or vomiting:  1. The medication in the patch is effective for 72 hours, after which it should be removed.  Wrap patch in a tissue and discard in the trash. Wash hands thoroughly with soap and water. 2. You may remove the patch earlier than 72 hours if you experience unpleasant side effects which may include dry mouth, dizziness or visual disturbances. 3. Avoid touching the patch. Wash your hands with soap and water after contact with the patch.   No ibuprofen, Advil, Aleve, Motrin, ketorolac, meloxicam, naproxen, or other NSAIDS until after 4:40 pm today if needed.

## 2023-12-07 NOTE — Anesthesia Postprocedure Evaluation (Signed)
 Anesthesia Post Note  Patient: Jenny Barnes  Procedure(s) Performed: CYSTOSCOPY/URETEROSCOPY/HOLMIUM LASER/STENT PLACEMENT (Left: Ureter)     Patient location during evaluation: PACU Anesthesia Type: General Level of consciousness: awake and alert Pain management: pain level controlled Vital Signs Assessment: post-procedure vital signs reviewed and stable Respiratory status: spontaneous breathing, nonlabored ventilation, respiratory function stable and patient connected to nasal cannula oxygen Cardiovascular status: blood pressure returned to baseline and stable Postop Assessment: no apparent nausea or vomiting Anesthetic complications: no   No notable events documented.  Last Vitals:  Vitals:   12/07/23 1130 12/07/23 1155  BP: (!) 119/94 (!) 119/90  Pulse: 69 (!) 53  Resp: 14 14  Temp:  36.6 C  SpO2: 99% 100%    Last Pain:  Vitals:   12/07/23 1130  TempSrc:   PainSc: 1                  Sargeant Nation

## 2023-12-07 NOTE — Anesthesia Preprocedure Evaluation (Signed)
 Anesthesia Evaluation  Patient identified by MRN, date of birth, ID band Patient awake    Reviewed: Allergy & Precautions, H&P , NPO status , Patient's Chart, lab work & pertinent test results  Airway Mallampati: II  TM Distance: >3 FB Neck ROM: Full    Dental no notable dental hx.    Pulmonary neg pulmonary ROS   Pulmonary exam normal breath sounds clear to auscultation       Cardiovascular negative cardio ROS Normal cardiovascular exam Rhythm:Regular Rate:Normal     Neuro/Psych negative neurological ROS  negative psych ROS   GI/Hepatic negative GI ROS, Neg liver ROS,,,  Endo/Other  Hypothyroidism    Renal/GU Renal diseaseUreteral calculus  negative genitourinary   Musculoskeletal negative musculoskeletal ROS (+)    Abdominal   Peds negative pediatric ROS (+)  Hematology negative hematology ROS (+)   Anesthesia Other Findings   Reproductive/Obstetrics negative OB ROS                             Anesthesia Physical Anesthesia Plan  ASA: 2  Anesthesia Plan: General   Post-op Pain Management: Tylenol PO (pre-op)*   Induction: Intravenous  PONV Risk Score and Plan: 3 and Ondansetron, Dexamethasone and Midazolam  Airway Management Planned: LMA  Additional Equipment:   Intra-op Plan:   Post-operative Plan: Extubation in OR  Informed Consent: I have reviewed the patients History and Physical, chart, labs and discussed the procedure including the risks, benefits and alternatives for the proposed anesthesia with the patient or authorized representative who has indicated his/her understanding and acceptance.     Dental advisory given  Plan Discussed with: CRNA  Anesthesia Plan Comments:        Anesthesia Quick Evaluation

## 2023-12-07 NOTE — H&P (Signed)
 CC/HPI: CC: Left ureteral calculi  HPI:  11/22/2023  28 year old female with history of nephrolithiasis requiring ureteroscopy in the past. Recently underwent a CT stone protocol on 10/04/2023 for pelvic pain and was found to have a cluster of calculi in the proximal left ureter the largest of which measuring 0.6 cm. She continues to have left-sided abdominal pain that has had pain for the past 2-1/2 weeks. Currently tolerating it okay. No nausea/vomiting or fever. Had a rough go with a stent during her pregnancy.   11/26/2023: 28 year old female who presents today with concerns of left flank pain associated with intermittent nausea and urinary frequency. She was seen in the emergency department yesterday and a CT scan shows 2 distal left ureteral stones 1 measuring 7 mm and the other measuring 6 mm. This is causing mild left hydronephrosis and hydroureter.     ALLERGIES: No Allergies    MEDICATIONS: Cipro 500 MG Tablet 1 tablet PO BID  Levothyroxine Sodium 125 MCG Tablet     GU PSH: Cysto Remove Stent FB Sim - 2021 Cystoscopy Insert Stent, Right - 2021 Ureteroscopic stone removal, Right - 2021       PSH Notes: wisdom teeth (2015)   NON-GU PSH: No Non-GU PSH    GU PMH: Ureteral calculus - 11/22/2023 History of urolithiasis - 2021, - 2021, - 2019 Flank Pain - 2021 Renal calculus - 2021 Ureteral obstruction - 2019    NON-GU PMH: Depression GERD Hyperthyroidism    FAMILY HISTORY: 2 daughters - Other 1 son - Other Hypertension - Mother, Grandmother nephrolithiasis - Brother, Mother, Grandmother, Sister    Notes: currently pregnant- expecting child   SOCIAL HISTORY: Marital Status: Married Preferred Language: English; Ethnicity: Not Hispanic Or Latino; Race: White Current Smoking Status: Patient has never smoked.   Tobacco Use Assessment Completed: Used Tobacco in last 30 days? Has never drank.  Does not drink caffeine. Patient's occupation Market researcher.    REVIEW  OF SYSTEMS:    GU Review Female:   Patient reports frequent urination. Patient denies hard to postpone urination, burning /pain with urination, get up at night to urinate, leakage of urine, stream starts and stops, trouble starting your stream, have to strain to urinate, and being pregnant.  Gastrointestinal (Upper):   Patient denies nausea, vomiting, and indigestion/ heartburn.  Gastrointestinal (Lower):   Patient denies diarrhea and constipation.  Constitutional:   Patient denies night sweats, fever, fatigue, and weight loss.  Skin:   Patient denies skin rash/ lesion and itching.  Eyes:   Patient denies blurred vision and double vision.  Ears/ Nose/ Throat:   Patient denies sore throat and sinus problems.  Hematologic/Lymphatic:   Patient denies swollen glands and easy bruising.  Cardiovascular:   Patient denies leg swelling and chest pains.  Respiratory:   Patient denies cough and shortness of breath.  Endocrine:   Patient denies excessive thirst.  Musculoskeletal:   Patient reports back pain. Patient denies joint pain.  Neurological:   Patient denies headaches and dizziness.  Psychologic:   Patient denies depression and anxiety.   Notes: abdominal and flank pain    VITAL SIGNS:      11/26/2023 11:02 AM  BP 114/81 mmHg  Pulse 81 /min   MULTI-SYSTEM PHYSICAL EXAMINATION:    Constitutional: Well-nourished. No physical deformities. Normally developed. Good grooming.  Cardiovascular: Normal temperature, normal extremity pulses, no swelling, no varicosities.  Skin: No paleness, no jaundice, no cyanosis. No lesion, no ulcer, no rash.  Neurologic / Psychiatric: Oriented  to time, oriented to place, oriented to person. No depression, no anxiety, no agitation.  Gastrointestinal: No mass, no tenderness, no rigidity, non obese abdomen.     Complexity of Data:  Records Review:   Previous Doctor Records, Previous Hospital Records, Previous Patient Records  X-Ray Review: KUB: Reviewed Films.  Reviewed Report. Discussed With Patient.  C.T. Abdomen/Pelvis: Flank pain   EXAM:  CT ABDOMEN AND PELVIS WITHOUT CONTRAST   TECHNIQUE:  Multidetector CT imaging of the abdomen and pelvis was performed  following the standard protocol without IV contrast.   RADIATION DOSE REDUCTION: This exam was performed according to the  departmental dose-optimization program which includes automated  exposure control, adjustment of the mA and/or kV according to  patient size and/or use of iterative reconstruction technique.   COMPARISON: CT 04/05/2020   FINDINGS:  Lower chest: Lung bases are clear   Hepatobiliary: No focal liver abnormality is seen. No gallstones,  gallbladder wall thickening, or biliary dilatation.   Pancreas: Unremarkable. No pancreatic ductal dilatation or  surrounding inflammatory changes.   Spleen: Normal in size without focal abnormality.   Adrenals/Urinary Tract: Adrenal glands are normal. Multiple punctate  nonobstructing right kidney stones. Mild left hydronephrosis and  hydroureter, secondary to at least 2 adjacent stones in the distal  left ureter, the more cranial stone measures about 7 mm and the more  caudal stone measures about 6 mm, stones are visualized at the mid  sacral level. The bladder is unremarkable   Stomach/Bowel: Stomach is within normal limits. Appendix appears  normal. No evidence of bowel wall thickening, distention, or  inflammatory changes.   Vascular/Lymphatic: No significant vascular findings are present. No  enlarged abdominal or pelvic lymph nodes.   Reproductive: Uterus and bilateral adnexa are unremarkable.   Other: Negative for pelvic effusion or free air. Small fat  containing periumbilical hernia   Musculoskeletal: No acute or significant osseous findings.   IMPRESSION:  1. Mild left hydronephrosis and hydroureter, secondary to at least 2  adjacent stones in the distal left ureter, the more cranial stone  measures about 7  mm and the more caudal stone measures about 6 mm.  2. Multiple punctate nonobstructing right kidney stones.    Electronically Signed  By: Jasmine Pang M.D.  On: 11/25/2023 20:20      PROCEDURES:         KUB - 74018  A single view of the abdomen is obtained. Tracing along the expected anatomical course of the left ureter near the expected left UVJ, there are 2 opacities noted 1 measures 6 mm and 1 measures 7 mm.      Patient confirmed No Neulasta OnPro Device.           Visit Complexity - G2211          Urinalysis w/Scope - 81001 Dipstick Dipstick Cont'd Micro  Color: Yellow Bilirubin: Neg WBC/hpf: 6 - 10/hpf  Appearance: Cloudy Ketones: Neg RBC/hpf: 0 - 2/hpf  Specific Gravity: 1.015 Blood: Neg Bacteria: Few (10-25/hpf)  pH: 6.5 Protein: Trace Cystals: NS (Not Seen)  Glucose: Neg Urobilinogen: 0.2 Casts: NS (Not Seen)    Nitrites: Neg Trichomonas: Not Present    Leukocyte Esterase: 1+ Mucous: Not Present      Epithelial Cells: 0 - 5/hpf      Yeast: NS (Not Seen)      Sperm: Not Present    Notes:            Ketoralac 60mg  - [id number], [id number]  zero  wasted The area was prepped and cleaned using sterile technique. A band aid was applied. The pt tolerated well.   Qty: 60 Adm. By: Tawnya Crook  Unit: mg Adm. On: 11/27/2023 12:14 PM  Route: IM Lot No: [id number]  Freq: None Exp. Date: 03/28/2025    Mfgr.:   Site: Right Hip   ASSESSMENT:      ICD-10 Details  1 GU:   Flank Pain - R10.84 Left, Acute, Systemic Symptoms  2   Ureteral calculus - N20.1 Left, Acute, Systemic Symptoms   PLAN:            Medications New Meds: Ketorolac Tromethamine 10 MG Tablet 1 tablet PO QID PRN For mild to moderate kidney stone pain  #20  0 Refill(s)  Tamsulosin HCl 0.4 MG Capsule 1 capsule PO Q HS   #30  1 Refill(s)  Ondansetron 8 MG Tablet Disintegrating 1 tablet PO Q 6 H PRN for nausea  #20  0 Refill(s)  oxyCODONE-Acetaminophen 5-325 MG Tablet 1 tablet PO Q 6 H PRN For severe kidney  stone pain  #15  0 Refill(s)  Pharmacy Name:  [id number]  Address:  [address]   [place], [place] [address]  Phone:  [phone]  Fax:  [phone]            Orders Labs CULTURE, URINE  X-Rays: KUB          Schedule Return Visit/Planned Activity: Next Available Appointment - Schedule Surgery          Document Letter(s):  Created for Patient: Clinical Summary         Notes:   Discussed CT findings and KUB findings today. I discussed definitive stone intervention including ESWL and ureteroscopy. I discussed the risk of failure for ESWL because she has multiple calculi and they are within the distal ureter. We discussed ureteroscopy in detail and advised that it would be about 90% successful. She would like to undergo ureteroscopy. Urinalysis will be sent for precautionary culture today. Stone intervention was discussed in detail today. For ureteroscopy, the patient understands that there is a chance for a staged procedure. Patient also understands that there is risk for bleeding, infection, injury to surrounding organs, and general risks of anesthesia. The patient also understands the placement of a stent and the risks of stent placement including, risk for infection, the risk for pain, and the risk for injury.         Next Appointment:      Next Appointment: 11/30/2023 02:45 PM    Appointment Type: Renal Ultrasound    Location: [facility] [phone]    Provider: Radiology Rm1 Radiology Rm 1    Reason for Visit: asap RUS/ OV     Signed by Bartholomew Crews, NP on 11/26/23 at 12:44 PM (EDT

## 2023-12-07 NOTE — Op Note (Signed)
 Operative Note  Preoperative diagnosis:  1.  Left ureteral calculus  Postoperative diagnosis: 1.  Left ureteral calculus  Procedure(s): 1.  Cystoscopy with left retrograde pyelogram, left ureteroscopy with laser lithotripsy and stone basketing, left ureteral stent placement  Surgeon: Modena Slater, MD  Assistants: None  Anesthesia: General None  Complications: None immediate  EBL: Minimal  Specimens: 1.  Ureteral calculus  Drains/Catheters: 1.  6 x 24 double-J ureteral stent  Intraoperative findings: 1.  Normal urethra and bladder 2.  Left ureteroscopy confirmed to embedded ureteral calculi that were fragmented and basket extracted.  3.  Left retrograde pyelogram after treatment of stone showed minimal mild left-sided hydronephrosis with no filling defect in the kidney  Indication: 28 year old female with left ureteral calculi presents for the presented to an operation.  Description of procedure:  The patient was identified and consent was obtained.  The patient was taken to the operating room and placed in the supine position.  The patient was placed under general anesthesia.  Perioperative antibiotics were administered.  The patient was placed in dorsal lithotomy.  Patient was prepped and draped in a standard sterile fashion and a timeout was performed.  A 21 French rigid cystoscope was advanced into the urethra and into the bladder.  Complete cystoscopy was performed with no abnormal findings.  The left ureter was cannulated with a sensor wire which was advanced up to the kidney under fluoroscopic guidance.  Semirigid ureteroscopy was performed alongside the wire up to the stones with which were laser fragmented followed by basket extraction into the bladder.  I advanced the scope up to the renal pelvis and no other calculi were seen.  I shot a retrograde pyelogram through the scope with findings noted above.  I withdrew the scope visualizing the ureter upon removal.  There were  no clinically significant stone fragments remaining.  There was no ureteral injury but there was some ureteral edema at the level of stone impaction.  I backloaded the wire onto rigid cystoscope and advanced that into the bladder followed by routine placement of a 6 x 24 double-J ureteral stent.  Fluoroscopy confirmed proximal placement and direct visualization confirmed a good coil within the bladder.  I drained the bladder withdrew the scope.  Patient tolerated the procedure well was stable postoperatively.  Plan: Follow-up in 1 week for stent removal.

## 2023-12-07 NOTE — Transfer of Care (Signed)
 Immediate Anesthesia Transfer of Care Note  Patient: Jenny Barnes  Procedure(s) Performed: CYSTOSCOPY/URETEROSCOPY/HOLMIUM LASER/STENT PLACEMENT (Left: Ureter)  Patient Location: PACU  Anesthesia Type:General  Level of Consciousness: awake and sedated  Airway & Oxygen Therapy: Patient Spontanous Breathing and Patient connected to face mask oxygen  Post-op Assessment: Report given to RN and Post -op Vital signs reviewed and stable  Post vital signs: Reviewed and stable  Last Vitals:  Vitals Value Taken Time  BP 114/86 12/07/23 1051  Temp    Pulse 58 12/07/23 1054  Resp 10 12/07/23 1054  SpO2 100 % 12/07/23 1054  Vitals shown include unfiled device data.  Last Pain:  Vitals:   12/07/23 0800  TempSrc: Oral  PainSc: 0-No pain      Patients Stated Pain Goal: 6 (12/07/23 0800)  Complications: No notable events documented.

## 2023-12-08 ENCOUNTER — Encounter (HOSPITAL_COMMUNITY): Payer: Self-pay | Admitting: Urology
